# Patient Record
Sex: Female | Born: 1950
Health system: Southern US, Community
[De-identification: ages and names within clinical notes are randomized; demographics above are authoritative.]

## PROBLEM LIST (undated history)

## (undated) DIAGNOSIS — E119 Type 2 diabetes mellitus without complications: Secondary | ICD-10-CM

## (undated) DIAGNOSIS — K219 Gastro-esophageal reflux disease without esophagitis: Secondary | ICD-10-CM

## (undated) DIAGNOSIS — Z9221 Personal history of antineoplastic chemotherapy: Secondary | ICD-10-CM

## (undated) DIAGNOSIS — C50919 Malignant neoplasm of unspecified site of unspecified female breast: Secondary | ICD-10-CM

## (undated) DIAGNOSIS — E559 Vitamin D deficiency, unspecified: Secondary | ICD-10-CM

## (undated) DIAGNOSIS — E785 Hyperlipidemia, unspecified: Secondary | ICD-10-CM

## (undated) HISTORY — PX: BREAST EXCISIONAL BIOPSY: SUR124

## (undated) HISTORY — PX: BREAST BIOPSY: SHX20

---

## 2004-04-25 DIAGNOSIS — Z9221 Personal history of antineoplastic chemotherapy: Secondary | ICD-10-CM

## 2004-04-25 DIAGNOSIS — C50919 Malignant neoplasm of unspecified site of unspecified female breast: Secondary | ICD-10-CM

## 2004-04-25 HISTORY — DX: Personal history of antineoplastic chemotherapy: Z92.21

## 2004-04-25 HISTORY — PX: MASTECTOMY: SHX3

## 2004-04-25 HISTORY — DX: Malignant neoplasm of unspecified site of unspecified female breast: C50.919

## 2005-01-11 ENCOUNTER — Ambulatory Visit: Payer: Self-pay | Admitting: Internal Medicine

## 2005-01-14 ENCOUNTER — Ambulatory Visit: Payer: Self-pay | Admitting: Internal Medicine

## 2005-01-27 ENCOUNTER — Other Ambulatory Visit: Payer: Self-pay

## 2005-01-27 ENCOUNTER — Ambulatory Visit: Payer: Self-pay | Admitting: General Surgery

## 2005-02-02 ENCOUNTER — Ambulatory Visit: Payer: Self-pay | Admitting: General Surgery

## 2005-02-11 ENCOUNTER — Ambulatory Visit: Payer: Self-pay | Admitting: Internal Medicine

## 2005-02-23 ENCOUNTER — Ambulatory Visit: Payer: Self-pay | Admitting: Internal Medicine

## 2005-03-10 ENCOUNTER — Ambulatory Visit: Payer: Self-pay | Admitting: General Surgery

## 2005-03-25 ENCOUNTER — Ambulatory Visit: Payer: Self-pay | Admitting: Internal Medicine

## 2005-04-25 ENCOUNTER — Ambulatory Visit: Payer: Self-pay | Admitting: Internal Medicine

## 2005-05-26 ENCOUNTER — Ambulatory Visit: Payer: Self-pay | Admitting: Internal Medicine

## 2005-06-23 ENCOUNTER — Ambulatory Visit: Payer: Self-pay | Admitting: Internal Medicine

## 2005-07-24 ENCOUNTER — Ambulatory Visit: Payer: Self-pay | Admitting: Internal Medicine

## 2005-08-23 ENCOUNTER — Ambulatory Visit: Payer: Self-pay | Admitting: Internal Medicine

## 2005-09-23 ENCOUNTER — Ambulatory Visit: Payer: Self-pay | Admitting: Internal Medicine

## 2005-10-23 ENCOUNTER — Ambulatory Visit: Payer: Self-pay | Admitting: Internal Medicine

## 2005-11-23 ENCOUNTER — Ambulatory Visit: Payer: Self-pay | Admitting: Internal Medicine

## 2005-12-24 ENCOUNTER — Ambulatory Visit: Payer: Self-pay | Admitting: Internal Medicine

## 2006-01-04 ENCOUNTER — Ambulatory Visit: Payer: Self-pay

## 2006-01-23 ENCOUNTER — Ambulatory Visit: Payer: Self-pay | Admitting: Internal Medicine

## 2006-02-23 ENCOUNTER — Ambulatory Visit: Payer: Self-pay | Admitting: Internal Medicine

## 2006-03-25 ENCOUNTER — Ambulatory Visit: Payer: Self-pay | Admitting: Internal Medicine

## 2006-04-25 ENCOUNTER — Ambulatory Visit: Payer: Self-pay | Admitting: Internal Medicine

## 2006-05-26 ENCOUNTER — Ambulatory Visit: Payer: Self-pay | Admitting: Internal Medicine

## 2006-06-24 ENCOUNTER — Ambulatory Visit: Payer: Self-pay | Admitting: Internal Medicine

## 2006-07-25 ENCOUNTER — Ambulatory Visit: Payer: Self-pay | Admitting: Internal Medicine

## 2006-10-24 ENCOUNTER — Ambulatory Visit: Payer: Self-pay | Admitting: Internal Medicine

## 2006-11-14 ENCOUNTER — Ambulatory Visit: Payer: Self-pay | Admitting: Internal Medicine

## 2006-11-24 ENCOUNTER — Ambulatory Visit: Payer: Self-pay | Admitting: Internal Medicine

## 2007-01-08 ENCOUNTER — Ambulatory Visit: Payer: Self-pay | Admitting: Internal Medicine

## 2007-02-24 ENCOUNTER — Ambulatory Visit: Payer: Self-pay | Admitting: Internal Medicine

## 2007-03-13 ENCOUNTER — Ambulatory Visit: Payer: Self-pay | Admitting: Internal Medicine

## 2007-03-26 ENCOUNTER — Ambulatory Visit: Payer: Self-pay | Admitting: Internal Medicine

## 2007-06-24 ENCOUNTER — Ambulatory Visit: Payer: Self-pay | Admitting: Internal Medicine

## 2007-07-10 ENCOUNTER — Ambulatory Visit: Payer: Self-pay | Admitting: Internal Medicine

## 2007-07-25 ENCOUNTER — Ambulatory Visit: Payer: Self-pay | Admitting: Internal Medicine

## 2007-11-24 ENCOUNTER — Ambulatory Visit: Payer: Self-pay | Admitting: Internal Medicine

## 2007-12-25 ENCOUNTER — Ambulatory Visit: Payer: Self-pay | Admitting: Internal Medicine

## 2008-01-08 ENCOUNTER — Ambulatory Visit: Payer: Self-pay

## 2008-01-24 ENCOUNTER — Ambulatory Visit: Payer: Self-pay | Admitting: Internal Medicine

## 2008-01-25 ENCOUNTER — Ambulatory Visit: Payer: Self-pay | Admitting: Internal Medicine

## 2008-02-24 ENCOUNTER — Ambulatory Visit: Payer: Self-pay | Admitting: Internal Medicine

## 2008-07-24 ENCOUNTER — Ambulatory Visit: Payer: Self-pay | Admitting: Internal Medicine

## 2008-07-29 ENCOUNTER — Ambulatory Visit: Payer: Self-pay | Admitting: Internal Medicine

## 2008-08-23 ENCOUNTER — Ambulatory Visit: Payer: Self-pay | Admitting: Internal Medicine

## 2009-01-07 ENCOUNTER — Ambulatory Visit: Payer: Self-pay

## 2009-01-23 ENCOUNTER — Ambulatory Visit: Payer: Self-pay | Admitting: Internal Medicine

## 2009-01-27 ENCOUNTER — Ambulatory Visit: Payer: Self-pay | Admitting: Internal Medicine

## 2009-02-23 ENCOUNTER — Ambulatory Visit: Payer: Self-pay | Admitting: Internal Medicine

## 2009-07-24 ENCOUNTER — Ambulatory Visit: Payer: Self-pay | Admitting: Internal Medicine

## 2009-07-28 ENCOUNTER — Ambulatory Visit: Payer: Self-pay | Admitting: Internal Medicine

## 2009-08-23 ENCOUNTER — Ambulatory Visit: Payer: Self-pay | Admitting: Internal Medicine

## 2010-01-21 ENCOUNTER — Ambulatory Visit: Payer: Self-pay

## 2010-03-24 ENCOUNTER — Ambulatory Visit: Payer: Self-pay | Admitting: Internal Medicine

## 2010-03-25 ENCOUNTER — Ambulatory Visit: Payer: Self-pay | Admitting: Internal Medicine

## 2010-11-22 ENCOUNTER — Ambulatory Visit: Payer: Self-pay | Admitting: Internal Medicine

## 2010-11-24 ENCOUNTER — Ambulatory Visit: Payer: Self-pay | Admitting: Internal Medicine

## 2010-12-25 ENCOUNTER — Ambulatory Visit: Payer: Self-pay | Admitting: Internal Medicine

## 2011-01-25 ENCOUNTER — Ambulatory Visit: Payer: Self-pay

## 2012-02-15 ENCOUNTER — Ambulatory Visit: Payer: Self-pay

## 2013-02-18 ENCOUNTER — Ambulatory Visit: Payer: Self-pay | Admitting: Family Medicine

## 2014-02-20 ENCOUNTER — Ambulatory Visit: Payer: Self-pay | Admitting: Family Medicine

## 2014-04-08 ENCOUNTER — Emergency Department: Payer: Self-pay | Admitting: Emergency Medicine

## 2014-04-08 LAB — CBC WITH DIFFERENTIAL/PLATELET
BASOS ABS: 0 10*3/uL (ref 0.0–0.1)
BASOS PCT: 0.5 %
EOS ABS: 0 10*3/uL (ref 0.0–0.7)
Eosinophil %: 0.1 %
HCT: 39.8 % (ref 35.0–47.0)
HGB: 12.7 g/dL (ref 12.0–16.0)
LYMPHS PCT: 18 %
Lymphocyte #: 1.5 10*3/uL (ref 1.0–3.6)
MCH: 28.9 pg (ref 26.0–34.0)
MCHC: 31.9 g/dL — ABNORMAL LOW (ref 32.0–36.0)
MCV: 91 fL (ref 80–100)
Monocyte #: 0.2 x10 3/mm (ref 0.2–0.9)
Monocyte %: 2.1 %
NEUTROS PCT: 79.3 %
Neutrophil #: 6.5 10*3/uL (ref 1.4–6.5)
Platelet: 321 10*3/uL (ref 150–440)
RBC: 4.4 10*6/uL (ref 3.80–5.20)
RDW: 14.1 % (ref 11.5–14.5)
WBC: 8.2 10*3/uL (ref 3.6–11.0)

## 2014-04-08 LAB — TROPONIN I
Troponin-I: <0.02 ng/mL
Troponin-I: <0.02 ng/mL

## 2014-04-08 LAB — COMPREHENSIVE METABOLIC PANEL
ANION GAP: 11 (ref 7–16)
AST: 13 U/L — AB (ref 15–37)
Albumin: 4.1 g/dL (ref 3.4–5.0)
Alkaline Phosphatase: 52 U/L
BUN: 6 mg/dL — AB (ref 7–18)
Bilirubin,Total: 0.6 mg/dL (ref 0.2–1.0)
CO2: 26 mmol/L (ref 21–32)
CREATININE: 1.01 mg/dL (ref 0.60–1.30)
Calcium, Total: 8.9 mg/dL (ref 8.5–10.1)
Chloride: 100 mmol/L (ref 98–107)
EGFR (African American): >60
EGFR (Non-African Amer.): 59 — ABNORMAL LOW
Glucose: 156 mg/dL — ABNORMAL HIGH (ref 65–99)
Osmolality: 275 (ref 275–301)
Potassium: 3.3 mmol/L — ABNORMAL LOW (ref 3.5–5.1)
SGPT (ALT): 35 U/L
SODIUM: 137 mmol/L (ref 136–145)
Total Protein: 7.7 g/dL (ref 6.4–8.2)

## 2014-04-08 LAB — LIPASE, BLOOD: Lipase: 119 U/L (ref 73–393)

## 2015-01-14 ENCOUNTER — Other Ambulatory Visit: Payer: Self-pay | Admitting: Family Medicine

## 2015-01-14 DIAGNOSIS — Z1231 Encounter for screening mammogram for malignant neoplasm of breast: Secondary | ICD-10-CM

## 2015-02-23 ENCOUNTER — Ambulatory Visit
Admission: RE | Admit: 2015-02-23 | Discharge: 2015-02-23 | Disposition: A | Discharge status: Home / Self Care | Payer: 59 | Source: Ambulatory Visit | Attending: Family Medicine | Admitting: Family Medicine

## 2015-02-23 DIAGNOSIS — Z1231 Encounter for screening mammogram for malignant neoplasm of breast: Secondary | ICD-10-CM | POA: Insufficient documentation

## 2015-02-23 HISTORY — DX: Malignant neoplasm of unspecified site of unspecified female breast: C50.919

## 2015-02-23 HISTORY — DX: Personal history of antineoplastic chemotherapy: Z92.21

## 2015-04-10 ENCOUNTER — Encounter: Payer: Self-pay | Admitting: *Deleted

## 2015-04-13 ENCOUNTER — Encounter: Payer: Self-pay | Admitting: Anesthesiology

## 2015-04-13 ENCOUNTER — Encounter
Admission: RE | Disposition: A | Discharge status: Home / Self Care | Payer: Self-pay | Source: Ambulatory Visit | Attending: Gastroenterology

## 2015-04-13 ENCOUNTER — Ambulatory Visit: Payer: 59 | Admitting: *Deleted

## 2015-04-13 ENCOUNTER — Ambulatory Visit
Admission: RE | Admit: 2015-04-13 | Discharge: 2015-04-13 | Disposition: A | Discharge status: Home / Self Care | Payer: 59 | Source: Ambulatory Visit | Attending: Gastroenterology | Admitting: Gastroenterology

## 2015-04-13 DIAGNOSIS — E785 Hyperlipidemia, unspecified: Secondary | ICD-10-CM | POA: Diagnosis not present

## 2015-04-13 DIAGNOSIS — E119 Type 2 diabetes mellitus without complications: Secondary | ICD-10-CM | POA: Diagnosis not present

## 2015-04-13 DIAGNOSIS — Z853 Personal history of malignant neoplasm of breast: Secondary | ICD-10-CM | POA: Insufficient documentation

## 2015-04-13 DIAGNOSIS — D123 Benign neoplasm of transverse colon: Secondary | ICD-10-CM | POA: Insufficient documentation

## 2015-04-13 DIAGNOSIS — Z87891 Personal history of nicotine dependence: Secondary | ICD-10-CM | POA: Insufficient documentation

## 2015-04-13 DIAGNOSIS — K219 Gastro-esophageal reflux disease without esophagitis: Secondary | ICD-10-CM | POA: Insufficient documentation

## 2015-04-13 DIAGNOSIS — E559 Vitamin D deficiency, unspecified: Secondary | ICD-10-CM | POA: Diagnosis not present

## 2015-04-13 DIAGNOSIS — Z886 Allergy status to analgesic agent status: Secondary | ICD-10-CM | POA: Diagnosis not present

## 2015-04-13 DIAGNOSIS — E7800 Pure hypercholesterolemia, unspecified: Secondary | ICD-10-CM | POA: Diagnosis not present

## 2015-04-13 DIAGNOSIS — Z1211 Encounter for screening for malignant neoplasm of colon: Secondary | ICD-10-CM | POA: Insufficient documentation

## 2015-04-13 DIAGNOSIS — Z888 Allergy status to other drugs, medicaments and biological substances status: Secondary | ICD-10-CM | POA: Insufficient documentation

## 2015-04-13 DIAGNOSIS — D124 Benign neoplasm of descending colon: Secondary | ICD-10-CM | POA: Diagnosis not present

## 2015-04-13 HISTORY — DX: Vitamin D deficiency, unspecified: E55.9

## 2015-04-13 HISTORY — DX: Hyperlipidemia, unspecified: E78.5

## 2015-04-13 HISTORY — DX: Type 2 diabetes mellitus without complications: E11.9

## 2015-04-13 HISTORY — PX: COLONOSCOPY WITH PROPOFOL: SHX5780

## 2015-04-13 HISTORY — DX: Gastro-esophageal reflux disease without esophagitis: K21.9

## 2015-04-13 SURGERY — COLONOSCOPY WITH PROPOFOL
Anesthesia: General

## 2015-04-13 MED ORDER — SODIUM CHLORIDE 0.9 % IV SOLN
INTRAVENOUS | Status: DC
  Administered 2015-04-13: 1000 mL via INTRAVENOUS

## 2015-04-13 MED ORDER — FENTANYL CITRATE (PF) 100 MCG/2ML IJ SOLN
INTRAMUSCULAR | Status: DC | PRN
  Administered 2015-04-13 (×2): 50 ug via INTRAVENOUS

## 2015-04-13 MED ORDER — MIDAZOLAM HCL 2 MG/2ML IJ SOLN
INTRAMUSCULAR | Status: DC | PRN
  Administered 2015-04-13 (×2): 1 mg via INTRAVENOUS

## 2015-04-13 MED ORDER — PROPOFOL 10 MG/ML IV BOLUS
INTRAVENOUS | Status: DC | PRN
  Administered 2015-04-13 (×4): 20 mg via INTRAVENOUS

## 2015-04-13 MED ORDER — LACTATED RINGERS IV SOLN
INTRAVENOUS | Status: DC | PRN
  Administered 2015-04-13: 12:00:00 via INTRAVENOUS

## 2015-04-13 MED ORDER — SODIUM CHLORIDE 0.9 % IV SOLN: INTRAVENOUS | Status: DC

## 2015-04-13 NOTE — Anesthesia Preprocedure Evaluation (Signed)
Anesthesia Evaluation  Patient identified by MRN, date of birth, ID band Patient awake    Reviewed: Allergy & Precautions, H&P , NPO status , Patient's Chart, lab work & pertinent test results  Airway Mallampati: III  TM Distance: >3 FB Neck ROM: limited    Dental  (+) Poor Dentition, Missing, Upper Dentures   Pulmonary former smoker,    Pulmonary exam normal breath sounds clear to auscultation       Cardiovascular Exercise Tolerance: Good (-) angina(-) Past MI and (-) DOE negative cardio ROS Normal cardiovascular exam Rhythm:regular Rate:Normal     Neuro/Psych negative neurological ROS  negative psych ROS   GI/Hepatic Neg liver ROS, GERD  Controlled,  Endo/Other  diabetes, Type 2  Renal/GU negative Renal ROS  negative genitourinary   Musculoskeletal   Abdominal   Peds  Hematology negative hematology ROS (+)   Anesthesia Other Findings Past Medical History:   Breast cancer ([place])                                            Comment:LT MASTECTOMY   S/P chemotherapy, time since greater than 12 w* 2006           Comment:BREAST CA   Vitamin D deficiency                                         Hyperlipidemia                                               GERD (gastroesophageal reflux disease)                       Diabetes mellitus without complication (HCC)                   Comment:borderline  Past Surgical History:   BREAST BIOPSY                                   Right 20+ YRS A*     Comment:EXCISIONAL - NEG  BMI    Body Mass Index   21.96 kg/m 2      Reproductive/Obstetrics negative OB ROS                             Anesthesia Physical Anesthesia Plan  ASA: III  Anesthesia Plan: General   Post-op Pain Management:    Induction:   Airway Management Planned:   Additional Equipment:   Intra-op Plan:   Post-operative Plan:   Informed Consent: I have reviewed the  patients History and Physical, chart, labs and discussed the procedure including the risks, benefits and alternatives for the proposed anesthesia with the patient or authorized representative who has indicated his/her understanding and acceptance.   Dental Advisory Given  Plan Discussed with: Anesthesiologist, CRNA and Surgeon  Anesthesia Plan Comments:         Anesthesia Quick Evaluation

## 2015-04-13 NOTE — Transfer of Care (Signed)
Immediate Anesthesia Transfer of Care Note  Patient: Candice Leblanc  Procedure(s) Performed: Procedure(s): COLONOSCOPY WITH PROPOFOL (N/A)  Patient Location: PACU  Anesthesia Type:General  Level of Consciousness: awake, alert  and oriented  Airway & Oxygen Therapy: Patient Spontanous Breathing and Patient connected to nasal cannula oxygen  Post-op Assessment: Report given to RN and Post -op Vital signs reviewed and stable  Post vital signs: Reviewed and stable  Last Vitals:  Filed Vitals:   04/13/15 1208  BP: 126/76  Pulse: 90  Temp: 36.2 C  Resp: 17    Complications: No apparent anesthesia complications

## 2015-04-13 NOTE — Op Note (Signed)
[facility] Gastroenterology Patient Name: Candice Leblanc Procedure Date: 04/13/2015 12:18 PM MRN: [id number] Account #: [id number] Date of Birth: October 03, 1950 Admit Type: Outpatient Age: 64 Room: [facility] ENDO ROOM 4 Gender: Female Note Status: Finalized Procedure:         Colonoscopy Indications:       Screening for colorectal malignant neoplasm Providers:         [name]. Candace Cruise, MD Referring MD:      Dion Body (Referring MD) Medicines:         Monitored Anesthesia Care Complications:     No immediate complications. Procedure:         Pre-Anesthesia Assessment:                    - Prior to the procedure, a History and Physical was                     performed, and patient medications, allergies and                     sensitivities were reviewed. The patient's tolerance of                     previous anesthesia was reviewed.                    - The risks and benefits of the procedure and the sedation                     options and risks were discussed with the patient. All                     questions were answered and informed consent was obtained.                    - After reviewing the risks and benefits, the patient was                     deemed in satisfactory condition to undergo the procedure.                    After obtaining informed consent, the colonoscope was                     passed under direct vision. Throughout the procedure, the                     patient's blood pressure, pulse, and oxygen saturations                     were monitored continuously. The Colonoscope was                     introduced through the anus and advanced to the the cecum,                     identified by appendiceal orifice and ileocecal valve. The                     colonoscopy was performed without difficulty. The patient                     tolerated the procedure well. The quality of the bowel  preparation was good. Findings:  Multiple sessile polyps were found in the descending colon, in the       transverse colon and at the hepatic flexure. The polyps were diminutive       in size. These polyps were removed with a cold snare. Polyp resection       was incomplete. The resected tissue was retrieved.      The exam was otherwise without abnormality.      The exam was otherwise without abnormality. Impression:        - Multiple diminutive polyps in the descending colon, in                     the transverse colon and at the hepatic flexure. These may                     be peudopolyps. Incomplete resection. Resected tissue                     retrieved.                    - The examination was otherwise normal.                    - The examination was otherwise normal. Recommendation:    - Discharge patient to home.                    - Await pathology results.                    - The findings and recommendations were discussed with the                     patient. Procedure Code(s): --- Professional ---                    [id number], Colonoscopy, flexible; with removal of tumor(s),                     polyp(s), or other lesion(s) by snare technique Diagnosis Code(s): --- Professional ---                    Z12.11, Encounter for screening for malignant neoplasm of                     colon                    D12.4, Benign neoplasm of descending colon                    D12.3, Benign neoplasm of transverse colon CPT copyright 2014 American Medical Association. All rights reserved. The codes documented in this report are preliminary and upon coder review may  be revised to meet current compliance requirements. Hulen Luster, MD 04/13/2015 12:39:00 PM This report has been signed electronically. Number of Addenda: 0 Note Initiated On: 04/13/2015 12:18 PM Scope Withdrawal Time: 0 hours 6 minutes 57 seconds  Total Procedure Duration: 0 hours 12 minutes 4 seconds       [facility]

## 2015-04-13 NOTE — Anesthesia Postprocedure Evaluation (Signed)
Anesthesia Post Note  Patient: Candice Leblanc  Procedure(s) Performed: Procedure(s) (LRB): COLONOSCOPY WITH PROPOFOL (N/A)  Patient location during evaluation: Endoscopy Anesthesia Type: General Level of consciousness: awake and alert Pain management: pain level controlled Vital Signs Assessment: post-procedure vital signs reviewed and stable Respiratory status: spontaneous breathing, nonlabored ventilation, respiratory function stable and patient connected to nasal cannula oxygen Cardiovascular status: blood pressure returned to baseline and stable Postop Assessment: no signs of nausea or vomiting Anesthetic complications: no    Last Vitals:  Filed Vitals:   04/13/15 1300 04/13/15 1310  BP: 119/62 135/70  Pulse: 62 69  Temp:    Resp: 16 15    Last Pain: There were no vitals filed for this visit.               Precious Haws Piscitello

## 2015-04-13 NOTE — H&P (Signed)
    Primary Care Physician:  Dion Body, MD Primary Gastroenterologist:  Dr. Candace Cruise  Pre-Procedure History & Physical: HPI:  Candice Leblanc is a 64 y.o. female is here for an colonocopy.   Past Medical History  Diagnosis Date  . Breast cancer ([place])     LT MASTECTOMY  . S/P chemotherapy, time since greater than 12 weeks 2006    BREAST CA  . Vitamin D deficiency   . Hyperlipidemia   . GERD (gastroesophageal reflux disease)   . Diabetes mellitus without complication ([place])     borderline    Past Surgical History  Procedure Laterality Date  . Breast biopsy Right 20+ YRS AGO    EXCISIONAL - NEG    Prior to Admission medications   Medication Sig Start Date End Date Taking? Authorizing Provider  alendronate (FOSAMAX) 10 MG tablet Take 10 mg by mouth every 7 (seven) days. Take with a full glass of water on an empty stomach.   Yes Historical Provider, MD  ergocalciferol (VITAMIN D2) 50000 UNITS capsule Take 50,000 Units by mouth once a week.   Yes Historical Provider, MD  fluticasone (FLONASE) 50 MCG/ACT nasal spray Place 2 sprays into both nostrils daily.   Yes Historical Provider, MD  ranitidine (ZANTAC) 150 MG tablet Take 150 mg by mouth daily.   Yes Historical Provider, MD    Allergies as of 04/03/2015  . (Not on File)    Family History  Problem Relation Age of Onset  . Breast cancer Neg Hx     Social History   Social History  . Marital Status: Divorced    Spouse Name: N/A  . Number of Children: N/A  . Years of Education: N/A   Occupational History  . Not on file.   Social History Main Topics  . Smoking status: Former Smoker -- 1.00 packs/day for 10 years    Types: Cigarettes  . Smokeless tobacco: Never Used  . Alcohol Use: Not on file  . Drug Use: No  . Sexual Activity: Not on file   Other Topics Concern  . Not on file   Social History Narrative    Review of Systems: See HPI, otherwise negative ROS  Physical Exam: There were no vitals  taken for this visit. General:   Alert,  pleasant and cooperative in NAD Head:  Normocephalic and atraumatic. Neck:  Supple; no masses or thyromegaly. Lungs:  Clear throughout to auscultation.    Heart:  Regular rate and rhythm. Abdomen:  Soft, nontender and nondistended. Normal bowel sounds, without guarding, and without rebound.   Neurologic:  Alert and  oriented x4;  grossly normal neurologically.  Impression/Plan: Candice Leblanc is here for an colonoscopy to be performed for screening.  Risks, benefits, limitations, and alternatives regarding  colonoscopy have been reviewed with the patient.  Questions have been answered.  All parties agreeable.   [name], [name], MD  04/13/2015, 10:15 AM

## 2015-04-15 LAB — SURGICAL PATHOLOGY

## 2015-04-16 ENCOUNTER — Encounter: Payer: Self-pay | Admitting: Gastroenterology

## 2015-06-16 DIAGNOSIS — E782 Mixed hyperlipidemia: Secondary | ICD-10-CM | POA: Diagnosis not present

## 2015-06-16 DIAGNOSIS — E559 Vitamin D deficiency, unspecified: Secondary | ICD-10-CM | POA: Diagnosis not present

## 2015-06-23 DIAGNOSIS — K219 Gastro-esophageal reflux disease without esophagitis: Secondary | ICD-10-CM | POA: Diagnosis not present

## 2015-06-23 DIAGNOSIS — E782 Mixed hyperlipidemia: Secondary | ICD-10-CM | POA: Diagnosis not present

## 2015-06-23 DIAGNOSIS — E559 Vitamin D deficiency, unspecified: Secondary | ICD-10-CM | POA: Diagnosis not present

## 2015-08-12 DIAGNOSIS — R69 Illness, unspecified: Secondary | ICD-10-CM | POA: Diagnosis not present

## 2016-01-06 DIAGNOSIS — R300 Dysuria: Secondary | ICD-10-CM | POA: Diagnosis not present

## 2016-01-08 ENCOUNTER — Other Ambulatory Visit: Payer: Self-pay | Admitting: Family Medicine

## 2016-01-08 DIAGNOSIS — Z1231 Encounter for screening mammogram for malignant neoplasm of breast: Secondary | ICD-10-CM

## 2016-02-24 ENCOUNTER — Ambulatory Visit
Admission: RE | Admit: 2016-02-24 | Discharge: 2016-02-24 | Disposition: A | Discharge status: Home / Self Care | Payer: Commercial Managed Care - HMO | Source: Ambulatory Visit | Attending: Family Medicine | Admitting: Family Medicine

## 2016-02-24 DIAGNOSIS — Z1231 Encounter for screening mammogram for malignant neoplasm of breast: Secondary | ICD-10-CM | POA: Insufficient documentation

## 2016-02-26 ENCOUNTER — Other Ambulatory Visit: Payer: Self-pay | Admitting: Family Medicine

## 2016-02-26 DIAGNOSIS — N6310 Unspecified lump in the right breast, unspecified quadrant: Secondary | ICD-10-CM

## 2016-03-10 ENCOUNTER — Ambulatory Visit
Admission: RE | Admit: 2016-03-10 | Discharge: 2016-03-10 | Disposition: A | Discharge status: Home / Self Care | Payer: Commercial Managed Care - HMO | Source: Ambulatory Visit | Attending: Family Medicine | Admitting: Family Medicine

## 2016-03-10 DIAGNOSIS — N6310 Unspecified lump in the right breast, unspecified quadrant: Secondary | ICD-10-CM

## 2016-03-10 DIAGNOSIS — R928 Other abnormal and inconclusive findings on diagnostic imaging of breast: Secondary | ICD-10-CM | POA: Diagnosis not present

## 2016-03-11 DIAGNOSIS — Z0000 Encounter for general adult medical examination without abnormal findings: Secondary | ICD-10-CM | POA: Diagnosis not present

## 2016-03-11 DIAGNOSIS — E559 Vitamin D deficiency, unspecified: Secondary | ICD-10-CM | POA: Diagnosis not present

## 2016-03-11 DIAGNOSIS — E782 Mixed hyperlipidemia: Secondary | ICD-10-CM | POA: Diagnosis not present

## 2016-03-25 DIAGNOSIS — E782 Mixed hyperlipidemia: Secondary | ICD-10-CM | POA: Diagnosis not present

## 2016-03-25 DIAGNOSIS — Z0000 Encounter for general adult medical examination without abnormal findings: Secondary | ICD-10-CM | POA: Diagnosis not present

## 2016-03-25 DIAGNOSIS — E559 Vitamin D deficiency, unspecified: Secondary | ICD-10-CM | POA: Diagnosis not present

## 2016-06-06 DIAGNOSIS — J069 Acute upper respiratory infection, unspecified: Secondary | ICD-10-CM | POA: Diagnosis not present

## 2016-06-09 DIAGNOSIS — J019 Acute sinusitis, unspecified: Secondary | ICD-10-CM | POA: Diagnosis not present

## 2016-06-09 DIAGNOSIS — B9689 Other specified bacterial agents as the cause of diseases classified elsewhere: Secondary | ICD-10-CM | POA: Diagnosis not present

## 2016-09-20 DIAGNOSIS — E782 Mixed hyperlipidemia: Secondary | ICD-10-CM | POA: Diagnosis not present

## 2016-09-20 DIAGNOSIS — E559 Vitamin D deficiency, unspecified: Secondary | ICD-10-CM | POA: Diagnosis not present

## 2016-09-27 DIAGNOSIS — E559 Vitamin D deficiency, unspecified: Secondary | ICD-10-CM | POA: Diagnosis not present

## 2016-09-27 DIAGNOSIS — E7800 Pure hypercholesterolemia, unspecified: Secondary | ICD-10-CM | POA: Diagnosis not present

## 2016-09-27 DIAGNOSIS — K219 Gastro-esophageal reflux disease without esophagitis: Secondary | ICD-10-CM | POA: Diagnosis not present

## 2016-12-19 DIAGNOSIS — M62838 Other muscle spasm: Secondary | ICD-10-CM | POA: Diagnosis not present

## 2017-01-16 ENCOUNTER — Other Ambulatory Visit: Payer: Self-pay | Admitting: Family Medicine

## 2017-01-16 DIAGNOSIS — Z1231 Encounter for screening mammogram for malignant neoplasm of breast: Secondary | ICD-10-CM

## 2017-02-27 ENCOUNTER — Ambulatory Visit
Admission: RE | Admit: 2017-02-27 | Discharge: 2017-02-27 | Disposition: A | Discharge status: Home / Self Care | Payer: Commercial Managed Care - HMO | Source: Ambulatory Visit | Attending: Family Medicine | Admitting: Family Medicine

## 2017-02-27 DIAGNOSIS — Z1231 Encounter for screening mammogram for malignant neoplasm of breast: Secondary | ICD-10-CM | POA: Diagnosis not present

## 2017-03-24 DIAGNOSIS — K219 Gastro-esophageal reflux disease without esophagitis: Secondary | ICD-10-CM | POA: Diagnosis not present

## 2017-03-24 DIAGNOSIS — E559 Vitamin D deficiency, unspecified: Secondary | ICD-10-CM | POA: Diagnosis not present

## 2017-03-24 DIAGNOSIS — E7800 Pure hypercholesterolemia, unspecified: Secondary | ICD-10-CM | POA: Diagnosis not present

## 2017-03-31 DIAGNOSIS — Z0000 Encounter for general adult medical examination without abnormal findings: Secondary | ICD-10-CM | POA: Diagnosis not present

## 2017-03-31 DIAGNOSIS — E559 Vitamin D deficiency, unspecified: Secondary | ICD-10-CM | POA: Diagnosis not present

## 2017-03-31 DIAGNOSIS — E7800 Pure hypercholesterolemia, unspecified: Secondary | ICD-10-CM | POA: Diagnosis not present

## 2017-09-22 DIAGNOSIS — E7800 Pure hypercholesterolemia, unspecified: Secondary | ICD-10-CM | POA: Diagnosis not present

## 2017-09-22 DIAGNOSIS — E559 Vitamin D deficiency, unspecified: Secondary | ICD-10-CM | POA: Diagnosis not present

## 2017-09-29 DIAGNOSIS — K219 Gastro-esophageal reflux disease without esophagitis: Secondary | ICD-10-CM | POA: Diagnosis not present

## 2017-09-29 DIAGNOSIS — E7800 Pure hypercholesterolemia, unspecified: Secondary | ICD-10-CM | POA: Diagnosis not present

## 2017-09-29 DIAGNOSIS — E559 Vitamin D deficiency, unspecified: Secondary | ICD-10-CM | POA: Diagnosis not present

## 2018-01-01 ENCOUNTER — Other Ambulatory Visit: Payer: Self-pay | Admitting: Family Medicine

## 2018-01-01 DIAGNOSIS — Z1231 Encounter for screening mammogram for malignant neoplasm of breast: Secondary | ICD-10-CM

## 2018-02-26 ENCOUNTER — Telehealth: Payer: Self-pay | Admitting: *Deleted

## 2018-02-26 NOTE — Telephone Encounter (Signed)
Opened in error

## 2018-02-28 ENCOUNTER — Ambulatory Visit
Admission: RE | Admit: 2018-02-28 | Discharge: 2018-02-28 | Disposition: A | Discharge status: Home / Self Care | Payer: Commercial Managed Care - HMO | Source: Ambulatory Visit | Attending: Family Medicine | Admitting: Family Medicine

## 2018-02-28 DIAGNOSIS — Z1231 Encounter for screening mammogram for malignant neoplasm of breast: Secondary | ICD-10-CM | POA: Diagnosis not present

## 2018-04-03 DIAGNOSIS — K219 Gastro-esophageal reflux disease without esophagitis: Secondary | ICD-10-CM | POA: Diagnosis not present

## 2018-04-03 DIAGNOSIS — E559 Vitamin D deficiency, unspecified: Secondary | ICD-10-CM | POA: Diagnosis not present

## 2018-04-03 DIAGNOSIS — E7800 Pure hypercholesterolemia, unspecified: Secondary | ICD-10-CM | POA: Diagnosis not present

## 2018-04-10 DIAGNOSIS — E559 Vitamin D deficiency, unspecified: Secondary | ICD-10-CM | POA: Diagnosis not present

## 2018-04-10 DIAGNOSIS — K219 Gastro-esophageal reflux disease without esophagitis: Secondary | ICD-10-CM | POA: Diagnosis not present

## 2018-04-10 DIAGNOSIS — Z0000 Encounter for general adult medical examination without abnormal findings: Secondary | ICD-10-CM | POA: Diagnosis not present

## 2018-04-10 DIAGNOSIS — E7800 Pure hypercholesterolemia, unspecified: Secondary | ICD-10-CM | POA: Diagnosis not present

## 2018-08-07 DIAGNOSIS — E559 Vitamin D deficiency, unspecified: Secondary | ICD-10-CM | POA: Diagnosis not present

## 2018-08-07 DIAGNOSIS — E7800 Pure hypercholesterolemia, unspecified: Secondary | ICD-10-CM | POA: Diagnosis not present

## 2018-08-14 DIAGNOSIS — E559 Vitamin D deficiency, unspecified: Secondary | ICD-10-CM | POA: Diagnosis not present

## 2018-08-14 DIAGNOSIS — Z780 Asymptomatic menopausal state: Secondary | ICD-10-CM | POA: Diagnosis not present

## 2018-08-14 DIAGNOSIS — Z0000 Encounter for general adult medical examination without abnormal findings: Secondary | ICD-10-CM | POA: Diagnosis not present

## 2018-08-14 DIAGNOSIS — E7800 Pure hypercholesterolemia, unspecified: Secondary | ICD-10-CM | POA: Diagnosis not present

## 2018-08-14 DIAGNOSIS — K219 Gastro-esophageal reflux disease without esophagitis: Secondary | ICD-10-CM | POA: Diagnosis not present

## 2019-01-07 ENCOUNTER — Other Ambulatory Visit: Payer: Self-pay | Admitting: Family Medicine

## 2019-01-07 DIAGNOSIS — Z1231 Encounter for screening mammogram for malignant neoplasm of breast: Secondary | ICD-10-CM

## 2019-02-06 DIAGNOSIS — Z780 Asymptomatic menopausal state: Secondary | ICD-10-CM | POA: Diagnosis not present

## 2019-02-06 DIAGNOSIS — E7800 Pure hypercholesterolemia, unspecified: Secondary | ICD-10-CM | POA: Diagnosis not present

## 2019-02-13 DIAGNOSIS — K219 Gastro-esophageal reflux disease without esophagitis: Secondary | ICD-10-CM | POA: Diagnosis not present

## 2019-02-13 DIAGNOSIS — Z87891 Personal history of nicotine dependence: Secondary | ICD-10-CM | POA: Diagnosis not present

## 2019-02-13 DIAGNOSIS — E7800 Pure hypercholesterolemia, unspecified: Secondary | ICD-10-CM | POA: Diagnosis not present

## 2019-02-13 DIAGNOSIS — E559 Vitamin D deficiency, unspecified: Secondary | ICD-10-CM | POA: Diagnosis not present

## 2019-03-04 ENCOUNTER — Ambulatory Visit
Admission: RE | Admit: 2019-03-04 | Discharge: 2019-03-04 | Disposition: A | Discharge status: Home / Self Care | Payer: Medicare HMO | Source: Ambulatory Visit | Attending: Family Medicine | Admitting: Family Medicine

## 2019-03-04 DIAGNOSIS — R921 Mammographic calcification found on diagnostic imaging of breast: Secondary | ICD-10-CM | POA: Diagnosis not present

## 2019-03-04 DIAGNOSIS — Z1231 Encounter for screening mammogram for malignant neoplasm of breast: Secondary | ICD-10-CM

## 2019-03-06 ENCOUNTER — Other Ambulatory Visit: Payer: Self-pay | Admitting: Family Medicine

## 2019-03-06 DIAGNOSIS — R928 Other abnormal and inconclusive findings on diagnostic imaging of breast: Secondary | ICD-10-CM

## 2019-03-06 DIAGNOSIS — R921 Mammographic calcification found on diagnostic imaging of breast: Secondary | ICD-10-CM

## 2019-03-13 ENCOUNTER — Ambulatory Visit
Admission: RE | Admit: 2019-03-13 | Discharge: 2019-03-13 | Disposition: A | Discharge status: Home / Self Care | Payer: Medicare HMO | Source: Ambulatory Visit | Attending: Family Medicine | Admitting: Family Medicine

## 2019-03-13 DIAGNOSIS — R921 Mammographic calcification found on diagnostic imaging of breast: Secondary | ICD-10-CM

## 2019-03-13 DIAGNOSIS — R928 Other abnormal and inconclusive findings on diagnostic imaging of breast: Secondary | ICD-10-CM | POA: Diagnosis not present

## 2019-07-12 ENCOUNTER — Ambulatory Visit: Payer: Medicare HMO | Attending: Internal Medicine

## 2019-07-12 ENCOUNTER — Other Ambulatory Visit: Payer: Self-pay

## 2019-07-12 DIAGNOSIS — Z23 Encounter for immunization: Secondary | ICD-10-CM

## 2019-07-12 NOTE — Progress Notes (Signed)
   Covid-19 Vaccination Clinic  Name:  Jesseka Swoveland    MRN: [id number] DOB: 1950-11-05  07/12/2019  Ms. Bieler was observed post Covid-19 immunization for 15 minutes without incident. She was provided with Vaccine Information Sheet and instruction to access the V-Safe system.   Ms. Cuffe was instructed to call 911 with any severe reactions post vaccine: Marland Kitchen Difficulty breathing  . Swelling of face and throat  . A fast heartbeat  . A bad rash all over body  . Dizziness and weakness   Immunizations Administered    Name Date Dose VIS Date Route   Pfizer COVID-19 Vaccine 07/12/2019  9:57 AM 0.3 mL 04/05/2019 Intramuscular   Manufacturer: [place]   Lot: [id number]   [place]: [id number]

## 2019-08-06 ENCOUNTER — Ambulatory Visit: Payer: Medicare HMO | Attending: Internal Medicine

## 2019-08-06 DIAGNOSIS — Z23 Encounter for immunization: Secondary | ICD-10-CM

## 2019-08-06 NOTE — Progress Notes (Signed)
   Covid-19 Vaccination Clinic  Name:  Candice Leblanc    MRN: [id number] DOB: [birth year]  08/06/2019  [name] was observed post Covid-19 immunization for 15 minutes without incident. She was provided with Vaccine Information Sheet and instruction to access the V-Safe system.   [name] was instructed to call 911 with any severe reactions post vaccine: Marland Kitchen Difficulty breathing  . Swelling of face and throat  . A fast heartbeat  . A bad rash all over body  . Dizziness and weakness   Immunizations Administered    Name Date Dose VIS Date Route   Pfizer COVID-19 Vaccine 08/06/2019 10:48 AM 0.3 mL 04/05/2019 Intramuscular   Manufacturer: [place]   Lot: [id number]   [place]: [id number]

## 2019-08-07 DIAGNOSIS — E7800 Pure hypercholesterolemia, unspecified: Secondary | ICD-10-CM | POA: Diagnosis not present

## 2019-08-14 DIAGNOSIS — Z0000 Encounter for general adult medical examination without abnormal findings: Secondary | ICD-10-CM | POA: Diagnosis not present

## 2019-08-14 DIAGNOSIS — E785 Hyperlipidemia, unspecified: Secondary | ICD-10-CM | POA: Diagnosis not present

## 2019-08-14 DIAGNOSIS — Z87891 Personal history of nicotine dependence: Secondary | ICD-10-CM | POA: Diagnosis not present

## 2019-10-22 DIAGNOSIS — Z87891 Personal history of nicotine dependence: Secondary | ICD-10-CM | POA: Diagnosis not present

## 2019-10-22 DIAGNOSIS — M545 Low back pain: Secondary | ICD-10-CM | POA: Diagnosis not present

## 2020-01-21 ENCOUNTER — Other Ambulatory Visit: Payer: Self-pay | Admitting: Family Medicine

## 2020-01-21 DIAGNOSIS — Z1231 Encounter for screening mammogram for malignant neoplasm of breast: Secondary | ICD-10-CM

## 2020-02-13 DIAGNOSIS — E7800 Pure hypercholesterolemia, unspecified: Secondary | ICD-10-CM | POA: Diagnosis not present

## 2020-02-20 DIAGNOSIS — E559 Vitamin D deficiency, unspecified: Secondary | ICD-10-CM | POA: Diagnosis not present

## 2020-02-20 DIAGNOSIS — K219 Gastro-esophageal reflux disease without esophagitis: Secondary | ICD-10-CM | POA: Diagnosis not present

## 2020-02-20 DIAGNOSIS — E7800 Pure hypercholesterolemia, unspecified: Secondary | ICD-10-CM | POA: Diagnosis not present

## 2020-02-20 DIAGNOSIS — F4321 Adjustment disorder with depressed mood: Secondary | ICD-10-CM | POA: Diagnosis not present

## 2020-03-04 ENCOUNTER — Ambulatory Visit
Admission: RE | Admit: 2020-03-04 | Discharge: 2020-03-04 | Disposition: A | Discharge status: Home / Self Care | Payer: Medicare HMO | Source: Ambulatory Visit | Attending: Family Medicine | Admitting: Family Medicine

## 2020-03-04 ENCOUNTER — Other Ambulatory Visit: Payer: Self-pay

## 2020-03-04 DIAGNOSIS — Z1231 Encounter for screening mammogram for malignant neoplasm of breast: Secondary | ICD-10-CM | POA: Insufficient documentation

## 2020-07-06 DIAGNOSIS — N390 Urinary tract infection, site not specified: Secondary | ICD-10-CM | POA: Diagnosis not present

## 2020-07-06 DIAGNOSIS — R1031 Right lower quadrant pain: Secondary | ICD-10-CM | POA: Diagnosis not present

## 2020-07-06 DIAGNOSIS — R1032 Left lower quadrant pain: Secondary | ICD-10-CM | POA: Diagnosis not present

## 2020-07-06 DIAGNOSIS — K219 Gastro-esophageal reflux disease without esophagitis: Secondary | ICD-10-CM | POA: Diagnosis not present

## 2020-07-17 DIAGNOSIS — R350 Frequency of micturition: Secondary | ICD-10-CM | POA: Diagnosis not present

## 2020-07-17 DIAGNOSIS — K219 Gastro-esophageal reflux disease without esophagitis: Secondary | ICD-10-CM | POA: Diagnosis not present

## 2020-08-12 DIAGNOSIS — E7800 Pure hypercholesterolemia, unspecified: Secondary | ICD-10-CM | POA: Diagnosis not present

## 2020-08-12 DIAGNOSIS — K219 Gastro-esophageal reflux disease without esophagitis: Secondary | ICD-10-CM | POA: Diagnosis not present

## 2020-08-19 DIAGNOSIS — Z0000 Encounter for general adult medical examination without abnormal findings: Secondary | ICD-10-CM | POA: Diagnosis not present

## 2020-08-19 DIAGNOSIS — D72819 Decreased white blood cell count, unspecified: Secondary | ICD-10-CM | POA: Diagnosis not present

## 2020-08-19 DIAGNOSIS — E785 Hyperlipidemia, unspecified: Secondary | ICD-10-CM | POA: Diagnosis not present

## 2020-09-01 ENCOUNTER — Inpatient Hospital Stay
Admission: EM | Admit: 2020-09-01 | Discharge: 2020-09-05 | DRG: 419 | Disposition: A | Discharge status: Home / Self Care | Payer: Medicare HMO | Attending: Internal Medicine | Admitting: Internal Medicine

## 2020-09-01 ENCOUNTER — Other Ambulatory Visit: Payer: Self-pay

## 2020-09-01 ENCOUNTER — Emergency Department: Payer: Medicare HMO

## 2020-09-01 DIAGNOSIS — Z9221 Personal history of antineoplastic chemotherapy: Secondary | ICD-10-CM | POA: Diagnosis not present

## 2020-09-01 DIAGNOSIS — Z79899 Other long term (current) drug therapy: Secondary | ICD-10-CM

## 2020-09-01 DIAGNOSIS — D132 Benign neoplasm of duodenum: Secondary | ICD-10-CM | POA: Diagnosis not present

## 2020-09-01 DIAGNOSIS — E876 Hypokalemia: Secondary | ICD-10-CM | POA: Diagnosis present

## 2020-09-01 DIAGNOSIS — Z886 Allergy status to analgesic agent status: Secondary | ICD-10-CM | POA: Diagnosis not present

## 2020-09-01 DIAGNOSIS — R748 Abnormal levels of other serum enzymes: Secondary | ICD-10-CM | POA: Diagnosis present

## 2020-09-01 DIAGNOSIS — K219 Gastro-esophageal reflux disease without esophagitis: Secondary | ICD-10-CM | POA: Diagnosis present

## 2020-09-01 DIAGNOSIS — Z7983 Long term (current) use of bisphosphonates: Secondary | ICD-10-CM | POA: Diagnosis not present

## 2020-09-01 DIAGNOSIS — N281 Cyst of kidney, acquired: Secondary | ICD-10-CM | POA: Diagnosis not present

## 2020-09-01 DIAGNOSIS — E119 Type 2 diabetes mellitus without complications: Secondary | ICD-10-CM | POA: Diagnosis present

## 2020-09-01 DIAGNOSIS — R109 Unspecified abdominal pain: Secondary | ICD-10-CM

## 2020-09-01 DIAGNOSIS — K8050 Calculus of bile duct without cholangitis or cholecystitis without obstruction: Secondary | ICD-10-CM | POA: Diagnosis present

## 2020-09-01 DIAGNOSIS — K8020 Calculus of gallbladder without cholecystitis without obstruction: Secondary | ICD-10-CM | POA: Diagnosis not present

## 2020-09-01 DIAGNOSIS — Z87891 Personal history of nicotine dependence: Secondary | ICD-10-CM

## 2020-09-01 DIAGNOSIS — Z9012 Acquired absence of left breast and nipple: Secondary | ICD-10-CM | POA: Diagnosis not present

## 2020-09-01 DIAGNOSIS — E782 Mixed hyperlipidemia: Secondary | ICD-10-CM

## 2020-09-01 DIAGNOSIS — K8070 Calculus of gallbladder and bile duct without cholecystitis without obstruction: Secondary | ICD-10-CM | POA: Diagnosis not present

## 2020-09-01 DIAGNOSIS — Z803 Family history of malignant neoplasm of breast: Secondary | ICD-10-CM | POA: Diagnosis not present

## 2020-09-01 DIAGNOSIS — Z20822 Contact with and (suspected) exposure to covid-19: Secondary | ICD-10-CM | POA: Diagnosis present

## 2020-09-01 DIAGNOSIS — K8010 Calculus of gallbladder with chronic cholecystitis without obstruction: Secondary | ICD-10-CM | POA: Diagnosis not present

## 2020-09-01 DIAGNOSIS — R9431 Abnormal electrocardiogram [ECG] [EKG]: Secondary | ICD-10-CM | POA: Diagnosis not present

## 2020-09-01 DIAGNOSIS — R1111 Vomiting without nausea: Secondary | ICD-10-CM | POA: Diagnosis not present

## 2020-09-01 DIAGNOSIS — Z853 Personal history of malignant neoplasm of breast: Secondary | ICD-10-CM

## 2020-09-01 DIAGNOSIS — R7989 Other specified abnormal findings of blood chemistry: Secondary | ICD-10-CM | POA: Diagnosis not present

## 2020-09-01 DIAGNOSIS — K838 Other specified diseases of biliary tract: Secondary | ICD-10-CM | POA: Diagnosis not present

## 2020-09-01 DIAGNOSIS — I440 Atrioventricular block, first degree: Secondary | ICD-10-CM | POA: Diagnosis present

## 2020-09-01 DIAGNOSIS — K8065 Calculus of gallbladder and bile duct with chronic cholecystitis with obstruction: Secondary | ICD-10-CM | POA: Diagnosis present

## 2020-09-01 DIAGNOSIS — R17 Unspecified jaundice: Secondary | ICD-10-CM

## 2020-09-01 LAB — CBC WITH DIFFERENTIAL/PLATELET
Abs Immature Granulocytes: 0.03 10*3/uL (ref 0.00–0.07)
Basophils Absolute: 0 10*3/uL (ref 0.0–0.1)
Basophils Relative: 0 %
Eosinophils Absolute: 0 10*3/uL (ref 0.0–0.5)
Eosinophils Relative: 0 %
HCT: 44.5 % (ref 36.0–46.0)
Hemoglobin: 14.8 g/dL (ref 12.0–15.0)
Immature Granulocytes: 0 %
Lymphocytes Relative: 5 %
Lymphs Abs: 0.5 10*3/uL — ABNORMAL LOW (ref 0.7–4.0)
MCH: 29.3 pg (ref 26.0–34.0)
MCHC: 33.3 g/dL (ref 30.0–36.0)
MCV: 88.1 fL (ref 80.0–100.0)
Monocytes Absolute: 0.4 10*3/uL (ref 0.1–1.0)
Monocytes Relative: 4 %
Neutro Abs: 8.7 10*3/uL — ABNORMAL HIGH (ref 1.7–7.7)
Neutrophils Relative %: 91 %
Platelets: 281 10*3/uL (ref 150–400)
RBC: 5.05 MIL/uL (ref 3.87–5.11)
RDW: 14.4 % (ref 11.5–15.5)
WBC: 9.6 10*3/uL (ref 4.0–10.5)
nRBC: 0 % (ref 0.0–0.2)

## 2020-09-01 LAB — COMPREHENSIVE METABOLIC PANEL
ALT: 717 U/L — ABNORMAL HIGH (ref 0–44)
AST: 847 U/L — ABNORMAL HIGH (ref 15–41)
Albumin: 4.4 g/dL (ref 3.5–5.0)
Alkaline Phosphatase: 150 U/L — ABNORMAL HIGH (ref 38–126)
Anion gap: 11 (ref 5–15)
BUN: 14 mg/dL (ref 8–23)
CO2: 26 mmol/L (ref 22–32)
Calcium: 9.7 mg/dL (ref 8.9–10.3)
Chloride: 99 mmol/L (ref 98–111)
Creatinine, Ser: 0.82 mg/dL (ref 0.44–1.00)
GFR, Estimated: >60 mL/min (ref >60)
Glucose, Bld: 117 mg/dL — ABNORMAL HIGH (ref 70–99)
Potassium: 3.8 mmol/L (ref 3.5–5.1)
Sodium: 136 mmol/L (ref 135–145)
Total Bilirubin: 4.5 mg/dL — ABNORMAL HIGH (ref 0.3–1.2)
Total Protein: 8 g/dL (ref 6.5–8.1)

## 2020-09-01 LAB — URINALYSIS, COMPLETE (UACMP) WITH MICROSCOPIC
Bacteria, UA: NONE SEEN
Bilirubin Urine: NEGATIVE
Glucose, UA: NEGATIVE mg/dL
Hgb urine dipstick: NEGATIVE
Ketones, ur: NEGATIVE mg/dL
Leukocytes,Ua: NEGATIVE
Nitrite: NEGATIVE
Protein, ur: NEGATIVE mg/dL
Specific Gravity, Urine: 1.014 (ref 1.005–1.030)
pH: 6 (ref 5.0–8.0)

## 2020-09-01 LAB — RESP PANEL BY RT-PCR (FLU A&B, COVID) ARPGX2
Influenza A by PCR: NEGATIVE
Influenza B by PCR: NEGATIVE
SARS Coronavirus 2 by RT PCR: NEGATIVE

## 2020-09-01 LAB — LIPASE, BLOOD: Lipase: 44 U/L (ref 11–51)

## 2020-09-01 MED ORDER — IBUPROFEN 400 MG PO TABS: 400.0000 mg | ORAL_TABLET | Freq: Once | ORAL | Status: DC

## 2020-09-01 MED ORDER — FAMOTIDINE 20 MG PO TABS
40.0000 mg | ORAL_TABLET | Freq: Two times a day (BID) | ORAL | Status: DC
  Administered 2020-09-01 – 2020-09-05 (×6): 40 mg via ORAL
  Filled 2020-09-01 (×7): qty 2

## 2020-09-01 MED ORDER — ONDANSETRON HCL 4 MG/2ML IJ SOLN: 4.0000 mg | Freq: Four times a day (QID) | INTRAMUSCULAR | Status: DC | PRN

## 2020-09-01 MED ORDER — ONDANSETRON HCL 4 MG PO TABS: 4.0000 mg | ORAL_TABLET | Freq: Four times a day (QID) | ORAL | Status: DC | PRN

## 2020-09-01 MED ORDER — FLUTICASONE PROPIONATE 50 MCG/ACT NA SUSP
2.0000 | Freq: Every day | NASAL | Status: DC
  Administered 2020-09-02 – 2020-09-05 (×2): 2 via NASAL
  Filled 2020-09-01: qty 16

## 2020-09-01 MED ORDER — MORPHINE SULFATE (PF) 2 MG/ML IV SOLN: 1.0000 mg | INTRAVENOUS | Status: AC | PRN

## 2020-09-01 MED ORDER — IBUPROFEN 400 MG PO TABS: 600.0000 mg | ORAL_TABLET | Freq: Four times a day (QID) | ORAL | Status: AC | PRN

## 2020-09-01 MED ORDER — LORAZEPAM 2 MG/ML IJ SOLN: 1.0000 mg | INTRAMUSCULAR | Status: DC | PRN

## 2020-09-01 NOTE — Progress Notes (Signed)
Patient oriented to room and call bell.  Independent in room.  Daughter at bedside.

## 2020-09-01 NOTE — ED Triage Notes (Signed)
70 yo fm with hx of hernia presents with epigastric pain since last night. Denies n/v/d

## 2020-09-01 NOTE — ED Notes (Signed)
Messaged RN bed assigned 

## 2020-09-01 NOTE — H&P (Signed)
History and Physical   Candice Leblanc JOA:[id number] DOB: Jan 15, 1951 DOA: 09/01/2020  PCP: Candice Body, MD  Outpatient Specialists: no Patient coming from: home  I have personally briefly reviewed patient's old medical records in [place].  Chief Concern: abdominal pain  HPI: Candice Leblanc is a 70 y.o. female with medical history significant for hyperlipidemia, GERD, history of left breast cancer s/p chemotherapy Aug 23, 2004) and masectomy, presents to the ED for chief concern of abdominal pain and nausea.   She states the pain started evening 08/31/20, and describes the pain as a burning sensation in the right upper quadrant, and at it's peak, the pain was 9/10, and now is 0/10. The pain lasted approximately about 1 hour while she was in bed. The pain improved with milk on evening of 08/31/20. She states she took one sucralfate 1 gm AM of 09/01/20.   She denies nausea, diarrhea, chest pain, shortness of breath.   Social history: she lives alone. Previously she lived with her mother and her mother passed away in 2019-08-24 from stroke. She formerly was a tobacco user, quit 2004/08/23. She denies etoh, recreational drugs. She formerly worked as a Cabin crew.   Vaccinations: Pfizer vaccines 07/12/2019, 4/31/21, 04/13/2020  ROS: Constitutional: no weight change, no fever ENT/Mouth: no sore throat, no rhinorrhea Eyes: no eye pain, no vision changes Cardiovascular: no chest pain, no dyspnea,  no edema, no palpitations Respiratory: no cough, no sputum, no wheezing Gastrointestinal: no nausea, no vomiting, no diarrhea, no constipation Genitourinary: no urinary incontinence, no dysuria, no hematuria Musculoskeletal: no arthralgias, no myalgias Skin: no skin lesions, no pruritus, Neuro: no weakness, no loss of consciousness, no syncope Psych: no anxiety, no depression, + decrease appetite Heme/Lymph: no bruising, no bleeding  ED Course: Discussed with EDP, patient  requiring hospitalization for chief concern of abdominal pain suspect choledocholithiasis.  Vitals in the ED showed temperature of 97.5, respiration rate of 15, blood pressure 135/72, spO2 of 98% on Room air, heart rate of 80.   Assessment/Plan  Principal Problem:   Choledocholithiasis Active Problems:   Elevated liver enzymes   Hyperlipidemia, mixed   Abdominal pain - concerning for choledocholithiasis - MRCP ordered - Dr. Marius Ditch has been consulted by EDP and will follow along with the patient during her hospitalization - Pain control: ibuprofen 600 mg PO q6h prn for fever, headache, mild pain, and morphine 1 mg IV q4h prn for moderate and severe pain - AM labs: CBC, BMP, and Hepatic function ordered  GERD - famotidine 40 mg BID  HLD - holding home atorvastatin due to elevated LFT  DVT prophylaxis - I have only ordered ted hose, as patient may get ERCP  Chart reviewed.   DVT prophylaxis: ted hose Code Status: full code  Diet: regular diet Family Communication: no Disposition Plan: pending clinical course Consults called: GI via EDP Admission status: med-surg, observation, telemetry  Past Medical History:  Diagnosis Date  . Breast cancer ([place]) 23-Aug-2004   LT MASTECTOMY  . Diabetes mellitus without complication (HCC)    borderline  . GERD (gastroesophageal reflux disease)   . Hyperlipidemia   . S/P chemotherapy, time since greater than 12 weeks 08-23-2004   BREAST CA  . Vitamin D deficiency    Past Surgical History:  Procedure Laterality Date  . BREAST EXCISIONAL BIOPSY Right 08-24-1998?   EXCISIONAL - NEG  . COLONOSCOPY WITH PROPOFOL N/A 04/13/2015   Procedure: COLONOSCOPY WITH PROPOFOL;  Surgeon: Hulen Luster, MD;  [facility]  ENDOSCOPY;  Service: Gastroenterology;  Laterality: N/A;  . MASTECTOMY Left 2006   breast ca   Social History:  reports that she has quit smoking. Her smoking use included cigarettes. She has a 10.00 pack-year smoking history. She has never used smokeless  tobacco. She reports that she does not drink alcohol and does not use drugs.  Allergies  Allergen Reactions  . Aspirin Other (See Comments)    Gi upset   Family History  Problem Relation Age of Onset  . Breast cancer Sister 93   Family history: Family history reviewed and not pertinent  Prior to Admission medications   Medication Sig Start Date End Date Taking? Authorizing Provider  alendronate (FOSAMAX) 10 MG tablet Take 10 mg by mouth every 7 (seven) days. Take with a full glass of water on an empty stomach.    [provider]  ergocalciferol (VITAMIN D2) 50000 UNITS capsule Take 50,000 Units by mouth once a week.    [provider]  fluticasone (FLONASE) 50 MCG/ACT nasal spray Place 2 sprays into both nostrils daily.    [provider]  ranitidine (ZANTAC) 150 MG tablet Take 150 mg by mouth daily.    [provider]   Physical Exam: Vitals:   09/01/20 1106 09/01/20 1107 09/01/20 1509  BP: (!) 145/75  135/72  Pulse: 80  95  Resp: 17  15  Temp: (!) 97.5 F (36.4 C)    TempSrc: Oral    SpO2: 100%  98%  Weight:  68.9 kg   Height:  5\' 6"  (1.676 m)    Constitutional: appears age appropriate, NAD, calm, comfortable Eyes: PERRL, lids and conjunctivae normal ENMT: Mucous membranes are moist. Posterior pharynx clear of any exudate or lesions. Age-appropriate dentition. Hearing appropriate Neck: normal, supple, no masses, no thyromegaly Respiratory: clear to auscultation bilaterally, no wheezing, no crackles. Normal respiratory effort. No accessory muscle use.  Cardiovascular: Regular rate and rhythm, no murmurs / rubs / gallops. No extremity edema. 2+ pedal pulses. No carotid bruits.  Abdomen: no tenderness, no masses palpated, no hepatosplenomegaly. Bowel sounds positive.  Musculoskeletal: no clubbing / cyanosis. No joint deformity upper and lower extremities. Good ROM, no contractures, no atrophy. Normal muscle tone.  Skin: no rashes, lesions,  ulcers. No induration Neurologic: Sensation intact. Strength 5/5 in all 4.  Psychiatric: Normal judgment and insight. Alert and oriented x 3. Normal mood.   EKG: not indicated   Chest x-ray on Admission: I personally reviewed and I agree with radiologist reading as below.  CT ABDOMEN PELVIS WO CONTRAST  Result Date: 09/01/2020 CLINICAL DATA:  Epigastric pain since last night. Patient denies nausea, vomiting and diarrhea. Elevated liver function studies. Biliary obstruction suspected. EXAM: CT ABDOMEN AND PELVIS WITHOUT CONTRAST TECHNIQUE: Multidetector CT imaging of the abdomen and pelvis was performed following the standard protocol without IV contrast. COMPARISON:  None. FINDINGS: Lower chest: Mild linear atelectasis or scarring in the right lower lobe. The lung bases are otherwise clear. Trace pericardial fluid with mild coronary and aortic atherosclerosis. Hepatobiliary: No focal hepatic abnormalities are identified on noncontrast imaging. The gallbladder is incompletely distended. No evidence of gallstones, gallbladder wall thickening or pericholecystic fluid. There is mild intra and extrahepatic biliary dilatation with the common hepatic duct measuring 9 mm in diameter. No calcified intraductal calculi are visualized. Pancreas: No evidence of pancreatic head or ampullary mass. There is no pancreatic ductal dilatation, surrounding inflammation or fluid collection. Spleen: Normal in size without focal abnormality. Adrenals/Urinary Tract: Both adrenal glands  appear normal. There are 2 prominent water density cysts in the interpolar region of the left kidney measuring up to 4.1 cm in diameter. Posteriorly in the mid left kidney, there is a subcentimeter hyperdense lesion which is likely a small hemorrhagic cysts. No evidence of urinary tract calculus or hydronephrosis. The bladder appears normal. Stomach/Bowel: No enteric contrast administered. The stomach appears unremarkable for its degree of  distension. No evidence of bowel wall thickening, distention or surrounding inflammatory change. The appendix appears normal. Vascular/Lymphatic: There are no enlarged abdominal or pelvic lymph nodes. Aortic and branch vessel atherosclerosis without acute vascular findings on noncontrast imaging. Reproductive: There is a 5.6 x 4.5 cm left adnexal mass on image 60/2. This appears homogeneous in density and abuts the uterus, possibly reflecting an exophytic fibroid. The uterus otherwise appears normal. No right adnexal mass or pelvic inflammatory changes. Other: No evidence of abdominal wall mass or hernia. No ascites. Musculoskeletal: No acute or significant osseous findings. Lumbar spine facet arthropathy. IMPRESSION: 1. Mild intrahepatic and extrahepatic biliary dilatation of undetermined etiology. No calcified stones are identified. Consider right upper quadrant abdominal ultrasound or MRCP for further evaluation. 2. No evidence of pancreatic or ampullary lesion. No pancreatic ductal dilatation. 3. Indeterminate left adnexal lesion, potentially an exophytic fibroid. This is incompletely evaluated by this noncontrast study, and further evaluation with pelvic ultrasound recommended. 4.  Aortic Atherosclerosis (ICD10-I70.0). Electronically Signed   By: Richardean Sale M.D.   On: 09/01/2020 15:22   US Abdomen Limited RUQ (LIVER/GB)  Result Date: 09/01/2020 CLINICAL DATA:  Elevated bilirubin EXAM: ULTRASOUND ABDOMEN LIMITED RIGHT UPPER QUADRANT COMPARISON:  None. FINDINGS: Gallbladder: Gallbladder is decompressed with multiple stones within. No wall thickening or pericholecystic fluid is noted. Negative sonographic Murphy's sign is elicited. Common bile duct: Diameter: 7 mm Liver: No focal lesion identified. Within normal limits in parenchymal echogenicity. Portal vein is patent on color Doppler imaging with normal direction of blood flow towards the liver. Other: None. IMPRESSION: Multiple gallstones in a  decompressed gallbladder. No other focal abnormality is noted. Electronically Signed   By: Inez Catalina M.D.   On: 09/01/2020 16:41   Labs on Admission: I have personally reviewed following labs  CBC: Recent Labs  Lab 09/01/20 1214  WBC 9.6  NEUTROABS 8.7*  HGB 14.8  HCT 44.5  MCV 88.1  PLT 132   Basic Metabolic Panel: Recent Labs  Lab 09/01/20 1214  NA 136  K 3.8  CL 99  CO2 26  GLUCOSE 117*  BUN 14  CREATININE 0.82  CALCIUM 9.7   GFR: Estimated Creatinine Clearance: 59.8 mL/min (by C-G formula based on SCr of 0.82 mg/dL).  Liver Function Tests: Recent Labs  Lab 09/01/20 1214  AST 847*  ALT 717*  ALKPHOS 150*  BILITOT 4.5*  PROT 8.0  ALBUMIN 4.4   Recent Labs  Lab 09/01/20 1214  LIPASE 44   Urine analysis:    Component Value Date/Time   COLORURINE YELLOW (A) 09/01/2020 1310   APPEARANCEUR CLEAR (A) 09/01/2020 1310   LABSPEC 1.014 09/01/2020 1310   PHURINE 6.0 09/01/2020 1310   GLUCOSEU NEGATIVE 09/01/2020 1310   HGBUR NEGATIVE 09/01/2020 1310   [place] 09/01/2020 [place] 09/01/2020 1310   PROTEINUR NEGATIVE 09/01/2020 1310   NITRITE NEGATIVE 09/01/2020 1310   LEUKOCYTESUR NEGATIVE 09/01/2020 1310   Dusan Lipford N Zae Kirtz D.O. [facility]  If 7PM-7AM, please contact overnight-coverage provider If 7AM-7PM, please contact day coverage provider [email or web address]  09/01/2020, 5:25 PM

## 2020-09-01 NOTE — ED Provider Notes (Signed)
[facility] Emergency Department Provider Note   ____________________________________________   Event Date/Time   First MD Initiated Contact with Patient 09/01/20 1105     (approximate)  I have reviewed the triage vital signs and the nursing notes.   HISTORY  Chief Complaint Abdominal Pain    HPI Alexyia Domingue Coltrain is a 70 y.o. female with past medical history of hyperlipidemia, GERD, diabetes who presents to the ED complaining of abdominal pain.  Patient reports that she woke up this morning around 8 AM with pain in her epigastrium.  She describes it as a burning pain similar to prior episodes intermittently over the past couple of weeks.  She has been seen by her PCP for this problem and prescribed famotidine as well as sucralfate.  She took a dose of sucralfate this morning and pain has since resolved.  She denies any associated chest pain, shortness of breath, nausea, vomiting, dysuria, flank pain, or changes in bowel movements.  Patient reports being told that she has a hernia in her upper stomach previously, will schedule GI follow-up recently but canceled this as she was feeling better.        Past Medical History:  Diagnosis Date  . Breast cancer ([place]) 2006   LT MASTECTOMY  . Diabetes mellitus without complication (HCC)    borderline  . GERD (gastroesophageal reflux disease)   . Hyperlipidemia   . S/P chemotherapy, time since greater than 12 weeks 2006   BREAST CA  . Vitamin D deficiency     Patient Active Problem List   Diagnosis Date Noted  . Choledocholithiasis 09/01/2020  . Elevated liver enzymes 09/01/2020  . Hyperlipidemia, mixed 09/01/2020    Past Surgical History:  Procedure Laterality Date  . BREAST EXCISIONAL BIOPSY Right 2000?   EXCISIONAL - NEG  . COLONOSCOPY WITH PROPOFOL N/A 04/13/2015   Procedure: COLONOSCOPY WITH PROPOFOL;  Surgeon: Hulen Luster, MD;  Location: [facility] ENDOSCOPY;  Service: Gastroenterology;  Laterality:  N/A;  . MASTECTOMY Left 2006   breast ca    Prior to Admission medications   Medication Sig Start Date End Date Taking? Authorizing Provider  alendronate (FOSAMAX) 10 MG tablet Take 10 mg by mouth every 7 (seven) days. Take with a full glass of water on an empty stomach.    [provider]  ergocalciferol (VITAMIN D2) 50000 UNITS capsule Take 50,000 Units by mouth once a week.    [provider]  fluticasone (FLONASE) 50 MCG/ACT nasal spray Place 2 sprays into both nostrils daily.    [provider]  ranitidine (ZANTAC) 150 MG tablet Take 150 mg by mouth daily.    [provider]    Allergies Aspirin  Family History  Problem Relation Age of Onset  . Breast cancer Sister 71    Social History Social History   Tobacco Use  . Smoking status: Former Smoker    Packs/day: 1.00    Years: 10.00    Pack years: 10.00    Types: Cigarettes  . Smokeless tobacco: Never Used  Substance Use Topics  . Alcohol use: Never  . Drug use: No    Review of Systems  Constitutional: No fever/chills Eyes: No visual changes. ENT: No sore throat. Cardiovascular: Denies chest pain. Respiratory: Denies shortness of breath. Gastrointestinal: Positive for abdominal pain.  No nausea, no vomiting.  No diarrhea.  No constipation. Genitourinary: Negative for dysuria. Musculoskeletal: Negative for back pain. Skin: Negative for rash. Neurological: Negative for headaches, focal weakness or  numbness.  ____________________________________________   PHYSICAL EXAM:  VITAL SIGNS: ED Triage Vitals  Enc Vitals Group     BP 09/01/20 1106 (!) 145/75     Pulse Rate 09/01/20 1106 80     Resp 09/01/20 1106 17     Temp 09/01/20 1106 (!) 97.5 F (36.4 C)     Temp Source 09/01/20 1106 Oral     SpO2 09/01/20 1106 100 %     Weight 09/01/20 1107 152 lb (68.9 kg)     Height 09/01/20 1107 5\' 6"  (1.676 m)     Head Circumference --      Peak Flow --      Pain Score 09/01/20  1107 9     Pain Loc --      Pain Edu? --      Excl. in [place]? --     Constitutional: Alert and oriented. Eyes: Conjunctivae are normal. Head: Atraumatic. Nose: No congestion/rhinnorhea. Mouth/Throat: Mucous membranes are moist. Neck: Normal ROM Cardiovascular: Normal rate, regular rhythm. Grossly normal heart sounds. Respiratory: Normal respiratory effort.  No retractions. Lungs CTAB. Gastrointestinal: Soft and nontender. No distention. Genitourinary: deferred Musculoskeletal: No lower extremity tenderness nor edema. Neurologic:  Normal speech and language. No gross focal neurologic deficits are appreciated. Skin:  Skin is warm, dry and intact. No rash noted. Psychiatric: Mood and affect are normal. Speech and behavior are normal.  ____________________________________________   LABS (all labs ordered are listed, but only abnormal results are displayed)  Labs Reviewed  CBC WITH DIFFERENTIAL/PLATELET - Abnormal; Notable for the following components:      Result Value   Neutro Abs 8.7 (*)    Lymphs Abs 0.5 (*)    All other components within normal limits  COMPREHENSIVE METABOLIC PANEL - Abnormal; Notable for the following components:   Glucose, Bld 117 (*)    AST 847 (*)    ALT 717 (*)    Alkaline Phosphatase 150 (*)    Total Bilirubin 4.5 (*)    All other components within normal limits  URINALYSIS, COMPLETE (UACMP) WITH MICROSCOPIC - Abnormal; Notable for the following components:   Color, Urine YELLOW (*)    APPearance CLEAR (*)    All other components within normal limits  RESP PANEL BY RT-PCR (FLU A&B, COVID) ARPGX2  LIPASE, BLOOD  HIV ANTIBODY (ROUTINE TESTING W REFLEX)  BASIC METABOLIC PANEL  CBC   ____________________________________________  EKG  ED ECG REPORT I, Blake Divine, the attending physician, personally viewed and interpreted this ECG.   Date: 09/01/2020  EKG Time: 12:35  Rate: 78  Rhythm: normal sinus rhythm  Axis: Normal  Intervals:none   ST&T Change: None   PROCEDURES  Procedure(s) performed (including Critical Care):  Procedures   ____________________________________________   INITIAL IMPRESSION / ASSESSMENT AND PLAN / ED COURSE       70 year old female with past medical history of hyperlipidemia, diabetes, and GERD who presents to the ED with episode of burning pain in her epigastrium when she woke up this morning that has since resolved following a dose of sucralfate.  She has no tenderness on exam and denies any pain moving up into her chest.  We will screen EKG and labs including LFTs and lipase, however symptoms sound most consistent with gastritis/GERD.  What she is describing is likely a hiatal hernia as there is no evidence of hernia on exam.  If screening labs are unremarkable, patient will be appropriate for discharge home with GI follow-up.  Labs remarkable for elevated transaminases  along with alkaline phosphatase and bilirubin.  Findings are concerning for obstructive biliary pathology, CT scan was performed and does show mildly increased CBD diameter at 9 mm.  There are no findings to suggest cholecystitis.  This was further assessed with right upper quadrant ultrasound, which shows significant quantity of gallstones with CBD of 7 mm.  Findings discussed with Dr. Marius Ditch of GI, who recommends proceeding with MRCP and states ERCP will be available if patient were to require it.  No findings to suggest cholangitis at this time, she has minimal pain and no leukocytosis.  Case discussed with hospitalist for admission.      ____________________________________________   FINAL CLINICAL IMPRESSION(S) / ED DIAGNOSES  Final diagnoses:  Elevated bilirubin  Choledocholithiasis     ED Discharge Orders    None       Note:  This document was prepared using Dragon voice recognition software and may include unintentional dictation errors.   Blake Divine, MD 09/01/20 [id number]

## 2020-09-02 ENCOUNTER — Encounter: Payer: Self-pay | Admitting: Internal Medicine

## 2020-09-02 ENCOUNTER — Observation Stay: Payer: Medicare HMO

## 2020-09-02 DIAGNOSIS — K8020 Calculus of gallbladder without cholecystitis without obstruction: Secondary | ICD-10-CM | POA: Diagnosis not present

## 2020-09-02 DIAGNOSIS — K8050 Calculus of bile duct without cholangitis or cholecystitis without obstruction: Secondary | ICD-10-CM | POA: Diagnosis not present

## 2020-09-02 DIAGNOSIS — K8070 Calculus of gallbladder and bile duct without cholecystitis without obstruction: Secondary | ICD-10-CM | POA: Diagnosis not present

## 2020-09-02 DIAGNOSIS — R748 Abnormal levels of other serum enzymes: Secondary | ICD-10-CM

## 2020-09-02 LAB — CBC
HCT: 37.4 % (ref 36.0–46.0)
Hemoglobin: 12.3 g/dL (ref 12.0–15.0)
MCH: 28.5 pg (ref 26.0–34.0)
MCHC: 32.9 g/dL (ref 30.0–36.0)
MCV: 86.8 fL (ref 80.0–100.0)
Platelets: 308 10*3/uL (ref 150–400)
RBC: 4.31 MIL/uL (ref 3.87–5.11)
RDW: 14.3 % (ref 11.5–15.5)
WBC: 5.8 10*3/uL (ref 4.0–10.5)
nRBC: 0 % (ref 0.0–0.2)

## 2020-09-02 LAB — HEPATIC FUNCTION PANEL
ALT: 554 U/L — ABNORMAL HIGH (ref 0–44)
AST: 343 U/L — ABNORMAL HIGH (ref 15–41)
Albumin: 3.8 g/dL (ref 3.5–5.0)
Alkaline Phosphatase: 160 U/L — ABNORMAL HIGH (ref 38–126)
Bilirubin, Direct: 1.4 mg/dL — ABNORMAL HIGH (ref 0.0–0.2)
Indirect Bilirubin: 3.8 mg/dL — ABNORMAL HIGH (ref 0.3–0.9)
Total Bilirubin: 5.2 mg/dL — ABNORMAL HIGH (ref 0.3–1.2)
Total Protein: 7.1 g/dL (ref 6.5–8.1)

## 2020-09-02 LAB — BASIC METABOLIC PANEL
Anion gap: 8 (ref 5–15)
BUN: 14 mg/dL (ref 8–23)
CO2: 24 mmol/L (ref 22–32)
Calcium: 8.9 mg/dL (ref 8.9–10.3)
Chloride: 101 mmol/L (ref 98–111)
Creatinine, Ser: 0.92 mg/dL (ref 0.44–1.00)
GFR, Estimated: >60 mL/min (ref >60)
Glucose, Bld: 101 mg/dL — ABNORMAL HIGH (ref 70–99)
Potassium: 3.2 mmol/L — ABNORMAL LOW (ref 3.5–5.1)
Sodium: 133 mmol/L — ABNORMAL LOW (ref 135–145)

## 2020-09-02 LAB — HIV ANTIBODY (ROUTINE TESTING W REFLEX): HIV Screen 4th Generation wRfx: NONREACTIVE

## 2020-09-02 LAB — MAGNESIUM: Magnesium: 2.1 mg/dL (ref 1.7–2.4)

## 2020-09-02 LAB — PHOSPHORUS: Phosphorus: 4.3 mg/dL (ref 2.5–4.6)

## 2020-09-02 MED ORDER — GADOBUTROL 1 MMOL/ML IV SOLN
6.0000 mL | Freq: Once | INTRAVENOUS | Status: AC | PRN
  Administered 2020-09-02: 6 mL via INTRAVENOUS

## 2020-09-02 MED ORDER — POTASSIUM CHLORIDE CRYS ER 20 MEQ PO TBCR
40.0000 meq | EXTENDED_RELEASE_TABLET | Freq: Once | ORAL | Status: AC
  Administered 2020-09-02: 40 meq via ORAL
  Filled 2020-09-02: qty 2

## 2020-09-02 NOTE — Consult Note (Signed)
Cephas Darby, MD [address]  [place]  [place], [place] [address]  Main: [phone]  Fax: [phone] Pager: [phone]   Consultation  Referring Provider:     No ref. provider found Primary Care Physician:  Dion Body, MD Primary Gastroenterologist: Althia Forts      Reason for Consultation:     Elevated bilirubin  Date of Admission:  09/01/2020 Date of Consultation:  09/02/2020         HPI:   Candice Leblanc is a 70 y.o. female with history of diabetes, history of breast cancer status postmastectomy presented yesterday with epigastric pain.  Apparently, patient has been having burning pain for last 2 weeks, and she was treated as heartburn by her PCP with famotidine and sucralfate.  Because the pain was severe, patient came to ER yesterday morning.  Patient denied any nausea, vomiting, fever, chills, patient was hemodynamically stable, labs revealed normal CBC, lipase, LFTs were significantly elevated alkaline phosphatase 150, AST 847, ALT 717, total bilirubin 4.5.  Patient initially underwent CT abdomen and pelvis without contrast, revealed mild intrahepatic and extrahepatic biliary dilation.  Subsequently, underwent ultrasound which revealed cholelithiasis.  Because of elevated bilirubin, I recommended MRCP.  MRCP confirmed choledocholithiasis, multiple filling defects within the common bile duct over several centimeters leading up to ampulla.  As well as cholelithiasis  Patient is currently asymptomatic, doing well, denies any symptoms As there is no evidence of cholangitis, patient is not started on antibiotics.  She is currently on regular diet  NSAIDs: None  Antiplts/Anticoagulants/Anti thrombotics: None  GI Procedures: Colonoscopy in 2016 - Multiple diminutive polyps in the descending colon, in the transverse colon and at the hepatic flexure. These may be peudopolyps. Incomplete resection. Resected tissue retrieved. DIAGNOSIS:  A. COLON POLYPS  NEAR HEPATIC FLEXURE X 5; COLD SNARE:  - ONE FRAGMENT OF COLONIC MUCOSA, NEGATIVE FOR HYPERPLASTIC CHANGES,  DYSPLASIA AND MALIGNANCY (ADDITIONAL DEEPER SECTIONS WERE REVIEWED).   Past Medical History:  Diagnosis Date  . Breast cancer ([place]) 2006   LT MASTECTOMY  . Diabetes mellitus without complication (HCC)    borderline  . GERD (gastroesophageal reflux disease)   . Hyperlipidemia   . S/P chemotherapy, time since greater than 12 weeks 2006   BREAST CA  . Vitamin D deficiency     Past Surgical History:  Procedure Laterality Date  . BREAST EXCISIONAL BIOPSY Right 2000?   EXCISIONAL - NEG  . COLONOSCOPY WITH PROPOFOL N/A 04/13/2015   Procedure: COLONOSCOPY WITH PROPOFOL;  Surgeon: Hulen Luster, MD;  Location: [facility] ENDOSCOPY;  Service: Gastroenterology;  Laterality: N/A;  . MASTECTOMY Left 2006   breast ca    Prior to Admission medications   Medication Sig Start Date End Date Taking? Authorizing Provider  Calcium Carb-Cholecalciferol 600-400 MG-UNIT TABS Take 1 tablet by mouth in the morning and at bedtime.   Yes [provider]  famotidine (PEPCID) 40 MG tablet Take 40 mg by mouth 2 (two) times daily.   Yes [provider]  fluticasone (FLONASE) 50 MCG/ACT nasal spray Place 2 sprays into both nostrils daily.   Yes [provider]  sucralfate (CARAFATE) 1 g tablet Take 1 g by mouth 4 (four) times daily. 07/17/20  Yes [provider]  alendronate (FOSAMAX) 10 MG tablet Take 10 mg by mouth every 7 (seven) days. Take with a full glass of water on an empty stomach. Patient not taking: Reported on 09/01/2020    [provider]  ergocalciferol (VITAMIN  D2) 50000 UNITS capsule Take 50,000 Units by mouth once a week. Patient not taking: Reported on 09/01/2020    [provider]  ranitidine (ZANTAC) 150 MG tablet Take 150 mg by mouth daily. Patient not taking: Reported on 09/01/2020    [provider]    Current  Facility-Administered Medications:  .  famotidine (PEPCID) tablet 40 mg, 40 mg, Oral, BID, Cox, Amy N, DO, 40 mg at 09/02/20 0830 .  fluticasone (FLONASE) 50 MCG/ACT nasal spray 2 spray, 2 spray, Each Nare, Daily, Cox, Amy N, DO, 2 spray at 09/02/20 0830 .  ibuprofen (ADVIL) tablet 600 mg, 600 mg, Oral, Q6H PRN, Cox, Amy N, DO .  morphine 2 MG/ML injection 1 mg, 1 mg, Intravenous, Q4H PRN, Cox, Amy N, DO .  ondansetron (ZOFRAN) tablet 4 mg, 4 mg, Oral, Q6H PRN **OR** ondansetron (ZOFRAN) injection 4 mg, 4 mg, Intravenous, Q6H PRN, Cox, Amy N, DO  Family History  Problem Relation Age of Onset  . Breast cancer Sister 49     Social History   Tobacco Use  . Smoking status: Former Smoker    Packs/day: 1.00    Years: 10.00    Pack years: 10.00    Types: Cigarettes  . Smokeless tobacco: Never Used  Substance Use Topics  . Alcohol use: Never  . Drug use: No    Allergies as of 09/01/2020 - Review Complete 09/01/2020  Allergen Reaction Noted  . Aspirin Other (See Comments) 04/10/2015  . Other Other (See Comments) 06/09/2014    Review of Systems:    All systems reviewed and negative except where noted in HPI.   Physical Exam:  Vital signs in last 24 hours: Temp:  [97.9 F (36.6 C)-99.1 F (37.3 C)] 97.9 F (36.6 C) (05/11 1059) Pulse Rate:  [65-99] 65 (05/11 1059) Resp:  [15-18] 18 (05/11 1059) BP: (100-135)/(55-115) 119/62 (05/11 1059) SpO2:  [97 %-100 %] 99 % (05/11 1059) Last BM Date: 09/01/20 General:   Pleasant, cooperative in NAD Head:  Normocephalic and atraumatic. Eyes:   No icterus.   Conjunctiva pink. PERRLA. Ears:  Normal auditory acuity. Neck:  Supple; no masses or thyroidomegaly Lungs: Respirations even and unlabored. Lungs clear to auscultation bilaterally.   No wheezes, crackles, or rhonchi.  Heart:  Regular rate and rhythm;  Without murmur, clicks, rubs or gallops Abdomen:  Soft, nondistended, nontender. Normal bowel sounds. No appreciable masses or  hepatomegaly.  No rebound or guarding.  Rectal:  Not performed. Msk:  Symmetrical without gross deformities.  Strength normal Extremities:  Without edema, cyanosis or clubbing. Neurologic:  Alert and oriented x3;  grossly normal neurologically. Skin:  Intact without significant lesions or rashes. Cervical Nodes:  No significant cervical adenopathy. Psych:  Alert and cooperative. Normal affect.  LAB RESULTS: CBC Latest Ref Rng & Units 09/02/2020 09/01/2020 04/08/2014  WBC 4.0 - 10.5 K/uL 5.8 9.6 8.2  Hemoglobin 12.0 - 15.0 g/dL 12.3 14.8 12.7  Hematocrit 36.0 - 46.0 % 37.4 44.5 39.8  Platelets 150 - 400 K/uL 308 281 321    BMET BMP Latest Ref Rng & Units 09/02/2020 09/01/2020 04/08/2014  Glucose 70 - 99 mg/dL 101(H) 117(H) 156(H)  BUN 8 - 23 mg/dL 14 14 6(L)  Creatinine 0.44 - 1.00 mg/dL 0.92 0.82 1.01  Sodium 135 - 145 mmol/L 133(L) 136 137  Potassium 3.5 - 5.1 mmol/L 3.2(L) 3.8 3.3(L)  Chloride 98 - 111 mmol/L 101 99 100  CO2 22 - 32 mmol/L _0 Calcium  8.9 - 10.3 mg/dL 8.9 9.7 8.9    LFT Hepatic Function Latest Ref Rng & Units 09/02/2020 09/01/2020 04/08/2014  Total Protein 6.5 - 8.1 g/dL 7.1 8.0 7.7  Albumin 3.5 - 5.0 g/dL 3.8 4.4 4.1  AST 15 - 41 U/L 343(H) 847(H) 13(L)  ALT 0 - 44 U/L 554(H) 717(H) 35  Alk Phosphatase 38 - 126 U/L 160(H) 150(H) 52  Total Bilirubin 0.3 - 1.2 mg/dL 5.2(H) 4.5(H) 0.6  Bilirubin, Direct 0.0 - 0.2 mg/dL 1.4(H) - -     STUDIES: CT ABDOMEN PELVIS WO CONTRAST  Result Date: 09/01/2020 CLINICAL DATA:  Epigastric pain since last night. Patient denies nausea, vomiting and diarrhea. Elevated liver function studies. Biliary obstruction suspected. EXAM: CT ABDOMEN AND PELVIS WITHOUT CONTRAST TECHNIQUE: Multidetector CT imaging of the abdomen and pelvis was performed following the standard protocol without IV contrast. COMPARISON:  None. FINDINGS: Lower chest: Mild linear atelectasis or scarring in the right lower lobe. The lung bases are otherwise  clear. Trace pericardial fluid with mild coronary and aortic atherosclerosis. Hepatobiliary: No focal hepatic abnormalities are identified on noncontrast imaging. The gallbladder is incompletely distended. No evidence of gallstones, gallbladder wall thickening or pericholecystic fluid. There is mild intra and extrahepatic biliary dilatation with the common hepatic duct measuring 9 mm in diameter. No calcified intraductal calculi are visualized. Pancreas: No evidence of pancreatic head or ampullary mass. There is no pancreatic ductal dilatation, surrounding inflammation or fluid collection. Spleen: Normal in size without focal abnormality. Adrenals/Urinary Tract: Both adrenal glands appear normal. There are 2 prominent water density cysts in the interpolar region of the left kidney measuring up to 4.1 cm in diameter. Posteriorly in the mid left kidney, there is a subcentimeter hyperdense lesion which is likely a small hemorrhagic cysts. No evidence of urinary tract calculus or hydronephrosis. The bladder appears normal. Stomach/Bowel: No enteric contrast administered. The stomach appears unremarkable for its degree of distension. No evidence of bowel wall thickening, distention or surrounding inflammatory change. The appendix appears normal. Vascular/Lymphatic: There are no enlarged abdominal or pelvic lymph nodes. Aortic and branch vessel atherosclerosis without acute vascular findings on noncontrast imaging. Reproductive: There is a 5.6 x 4.5 cm left adnexal mass on image 60/2. This appears homogeneous in density and abuts the uterus, possibly reflecting an exophytic fibroid. The uterus otherwise appears normal. No right adnexal mass or pelvic inflammatory changes. Other: No evidence of abdominal wall mass or hernia. No ascites. Musculoskeletal: No acute or significant osseous findings. Lumbar spine facet arthropathy. IMPRESSION: 1. Mild intrahepatic and extrahepatic biliary dilatation of undetermined etiology. No  calcified stones are identified. Consider right upper quadrant abdominal ultrasound or MRCP for further evaluation. 2. No evidence of pancreatic or ampullary lesion. No pancreatic ductal dilatation. 3. Indeterminate left adnexal lesion, potentially an exophytic fibroid. This is incompletely evaluated by this noncontrast study, and further evaluation with pelvic ultrasound recommended. 4.  Aortic Atherosclerosis (ICD10-I70.0). Electronically Signed   By: Richardean Sale M.D.   On: 09/01/2020 15:22   MR 3D Recon At Scanner  Result Date: 09/02/2020 CLINICAL DATA:  70 year old female with acute onset epigastric pain and burning. Gallstone noted on ultrasound. EXAM: MRI ABDOMEN WITHOUT AND WITH CONTRAST (INCLUDING MRCP) TECHNIQUE: Multiplanar multisequence MR imaging of the abdomen was performed both before and after the administration of intravenous contrast. Heavily T2-weighted images of the biliary and pancreatic ducts were obtained, and three-dimensional MRCP images were rendered by post processing. CONTRAST:  29m GADAVIST GADOBUTROL 1 MMOL/ML IV SOLN COMPARISON:  CT  09/01/2020 ultrasound 22 FINDINGS: Lower chest:  Lung bases are clear. Hepatobiliary: Nosignificant intrahepatic duct dilatation. The common hepatic duct and common bile ducts are mildly dilated with the common bile duct measuring 9 mm. There are multiple filling defects within the common bile duct over several cm leading up to the ampulla. Filling defects are well seen on coronal image 16/series 3. The gallbladder is contracted around multiple small stones similar to the stones in the common bile duct. Stones measure approximately 2-3 mm each. No focal hepatic lesion. Pancreas: Normal pancreatic parenchymal intensity. No ductal dilatation or inflammation. Spleen: Normal spleen. Adrenals/urinary tract: Adrenal glands normal. Benign-appearing cyst of the RIGHT kidney. Stomach/Bowel: Stomach and limited of the small bowel is unremarkable  Vascular/Lymphatic: Abdominal aortic normal caliber. No retroperitoneal periportal lymphadenopathy. Musculoskeletal: No aggressive osseous lesion IMPRESSION: 1. Multiple stones within the common bile duct (choledocholithiasis). Minimal intrahepatic duct dilatation. 2. Gallbladder contracted around multiple gallstones. 3. No evidence of pancreatitis. Electronically Signed   By: Suzy Bouchard M.D.   On: 09/02/2020 07:45   MR ABDOMEN MRCP W WO CONTAST  Result Date: 09/02/2020 CLINICAL DATA:  70 year old female with acute onset epigastric pain and burning. Gallstone noted on ultrasound. EXAM: MRI ABDOMEN WITHOUT AND WITH CONTRAST (INCLUDING MRCP) TECHNIQUE: Multiplanar multisequence MR imaging of the abdomen was performed both before and after the administration of intravenous contrast. Heavily T2-weighted images of the biliary and pancreatic ducts were obtained, and three-dimensional MRCP images were rendered by post processing. CONTRAST:  87m GADAVIST GADOBUTROL 1 MMOL/ML IV SOLN COMPARISON:  CT 09/01/2020 ultrasound 22 FINDINGS: Lower chest:  Lung bases are clear. Hepatobiliary: Nosignificant intrahepatic duct dilatation. The common hepatic duct and common bile ducts are mildly dilated with the common bile duct measuring 9 mm. There are multiple filling defects within the common bile duct over several cm leading up to the ampulla. Filling defects are well seen on coronal image 16/series 3. The gallbladder is contracted around multiple small stones similar to the stones in the common bile duct. Stones measure approximately 2-3 mm each. No focal hepatic lesion. Pancreas: Normal pancreatic parenchymal intensity. No ductal dilatation or inflammation. Spleen: Normal spleen. Adrenals/urinary tract: Adrenal glands normal. Benign-appearing cyst of the RIGHT kidney. Stomach/Bowel: Stomach and limited of the small bowel is unremarkable Vascular/Lymphatic: Abdominal aortic normal caliber. No retroperitoneal periportal  lymphadenopathy. Musculoskeletal: No aggressive osseous lesion IMPRESSION: 1. Multiple stones within the common bile duct (choledocholithiasis). Minimal intrahepatic duct dilatation. 2. Gallbladder contracted around multiple gallstones. 3. No evidence of pancreatitis. Electronically Signed   By: SSuzy BouchardM.D.   On: 09/02/2020 07:45   UKoreaAbdomen Limited RUQ (LIVER/GB)  Result Date: 09/01/2020 CLINICAL DATA:  Elevated bilirubin EXAM: ULTRASOUND ABDOMEN LIMITED RIGHT UPPER QUADRANT COMPARISON:  None. FINDINGS: Gallbladder: Gallbladder is decompressed with multiple stones within. No wall thickening or pericholecystic fluid is noted. Negative sonographic Murphy's sign is elicited. Common bile duct: Diameter: 7 mm Liver: No focal lesion identified. Within normal limits in parenchymal echogenicity. Portal vein is patent on color Doppler imaging with normal direction of blood flow towards the liver. Other: None. IMPRESSION: Multiple gallstones in a decompressed gallbladder. No other focal abnormality is noted. Electronically Signed   By: MInez CatalinaM.D.   On: 09/01/2020 16:41      Impression / Plan:   CEstalee Mccandlishis a 70y.o. female with history of diabetes, is admitted with epigastric and right upper quadrant pain, LFTs revealed obstructive pattern, imaging confirmed choledocholithiasis  Choledocholithiasis No evidence of gallstone  pancreatitis or ascending cholangitis Transaminases are improving, total bilirubin is slightly elevated Patient has been afebrile since admission with no evidence of leukocytosis Okay to defer antibiotics at this time unless patient spikes fever Please switch from solids to clear liquids only Maintenance IV fluids N.p.o. effective 5 AM tomorrow Plan for ERCP tomorrow by Dr. Allen Norris Recommend to consult general surgery for cholecystectomy  Thank you for involving me in the care of this patient.      LOS: 0 days   Sherri Sear, MD  09/02/2020, 12:12  PM   Note: This dictation was prepared with Dragon dictation along with smaller phrase technology. Any transcriptional errors that result from this process are unintentional.

## 2020-09-02 NOTE — Progress Notes (Addendum)
Progress Note    Candice Leblanc  CVE:[id number] DOB: July 22, 1950  DOA: 09/01/2020 PCP: Dion Body, MD      Brief Narrative:    Medical records reviewed and are as summarized below:  Candice Leblanc is a 69 y.o. female with past medical history significant for hyperlipidemia, GERD, borderline diabetes mellitus, remote history of breast cancer status post left mastectomy and chemotherapy in 2006.  She presented to the hospital because of nausea and abdominal pain.  Work-up revealed choledocholithiasis, cholelithiasis and elevated liver enzymes.    Assessment/Plan:   Principal Problem:   Choledocholithiasis Active Problems:   Elevated liver enzymes   Hyperlipidemia, mixed    Body mass index is 24.53 kg/m.   Abdominal pain, choledocholithiasis, cholelithiasis, elevated liver enzymes: No evidence of pancreatitis, cholecystitis or cholangitis.  Plan for ERCP tomorrow.  Consulted general surgery to assist with management.  Surgeon plans to perform cholecystectomy prior to discharge.  Repeat liver enzymes tomorrow.  Hypokalemia: Replete potassium and monitor levels.  Check magnesium level.  First-degree AV block on telemetry.  Continue to monitor.  Other comorbidities include hyperlipidemia, GERD, borderline diabetes mellitus, remote history of breast cancer status post left mastectomy in 2006   Diet Order            Diet NPO time specified Except for: Ice Chips, Sips with Meds  Diet effective 0500 tomorrow           Diet clear liquid Room service appropriate? Yes; Fluid consistency: Thin  Diet effective now                    Consultants:  Gastroenterologist  General surgeon  Procedures:  None    Medications:   . famotidine  40 mg Oral BID  . fluticasone  2 spray Each Nare Daily   Continuous Infusions:   Anti-infectives (From admission, onward)   None             Family Communication/Anticipated D/C date and  plan/Code Status   DVT prophylaxis: Place TED hose Start: 09/01/20 1712     Code Status: Full Code  Family Communication: None Disposition Plan:    Status is: Observation  The patient will require care spanning > 2 midnights and should be moved to inpatient because: Ongoing diagnostic testing needed not appropriate for outpatient work up and Inpatient level of care appropriate due to severity of illness  Dispo: The patient is from: Home              Anticipated d/c is to: Home              Patient currently is not medically stable to d/c.   Difficult to place patient No           Subjective:   Abdominal pain is better.  No nausea or vomiting.  Objective:    Vitals:   09/01/20 2327 09/02/20 0449 09/02/20 0739 09/02/20 1059  BP: (!) 111/57 (!) 100/55 (!) 113/55 119/62  Pulse: 78 74 71 65  Resp: 15 16 16 18   Temp: 99.1 F (37.3 C) 98.9 F (37.2 C) 98.1 F (36.7 C) 97.9 F (36.6 C)  TempSrc:    Oral  SpO2: 98% 97% 98% 99%  Weight:      Height:       No data found.   Intake/Output Summary (Last 24 hours) at 09/02/2020 1515 Last data filed at 09/02/2020 1406 Gross per 24 hour  Intake 1560 ml  Output --  Net 1560 ml   Filed Weights   09/01/20 1107  Weight: 68.9 kg    Exam:  GEN: NAD SKIN: Warm and dry EYES: EOMI ENT: MMM CV: RRR PULM: CTA B ABD: soft, ND, NT, +BS CNS: AAO x 3, non focal EXT: No edema or tenderness        Data Reviewed:   I have personally reviewed following labs and imaging studies:  Labs: Labs show the following:   Basic Metabolic Panel: Recent Labs  Lab 09/01/20 1214 09/02/20 0315  NA 136 133*  K 3.8 3.2*  CL 99 101  CO2 26 24  GLUCOSE 117* 101*  BUN 14 14  CREATININE 0.82 0.92  CALCIUM 9.7 8.9  MG  --  2.1  PHOS  --  4.3   GFR Estimated Creatinine Clearance: 53.3 mL/min (by C-G formula based on SCr of 0.92 mg/dL). Liver Function Tests: Recent Labs  Lab 09/01/20 1214 09/02/20 0315  AST 847* 343*   ALT 717* 554*  ALKPHOS 150* 160*  BILITOT 4.5* 5.2*  PROT 8.0 7.1  ALBUMIN 4.4 3.8   Recent Labs  Lab 09/01/20 1214  LIPASE 44   No results for input(s): AMMONIA in the last 168 hours. Coagulation profile No results for input(s): INR, PROTIME in the last 168 hours.  CBC: Recent Labs  Lab 09/01/20 1214 09/02/20 0315  WBC 9.6 5.8  NEUTROABS 8.7*  --   HGB 14.8 12.3  HCT 44.5 37.4  MCV 88.1 86.8  PLT 281 308   Cardiac Enzymes: No results for input(s): CKTOTAL, CKMB, CKMBINDEX, TROPONINI in the last 168 hours. BNP (last 3 results) No results for input(s): PROBNP in the last 8760 hours. CBG: No results for input(s): GLUCAP in the last 168 hours. D-Dimer: No results for input(s): DDIMER in the last 72 hours. Hgb A1c: No results for input(s): HGBA1C in the last 72 hours. Lipid Profile: No results for input(s): CHOL, HDL, LDLCALC, TRIG, CHOLHDL, LDLDIRECT in the last 72 hours. Thyroid function studies: No results for input(s): TSH, T4TOTAL, T3FREE, THYROIDAB in the last 72 hours.  Invalid input(s): FREET3 Anemia work up: No results for input(s): VITAMINB12, FOLATE, FERRITIN, TIBC, IRON, RETICCTPCT in the last 72 hours. Sepsis Labs: Recent Labs  Lab 09/01/20 1214 09/02/20 0315  WBC 9.6 5.8    Microbiology Recent Results (from the past 240 hour(s))  Resp Panel by RT-PCR (Flu A&B, Covid) Nasopharyngeal Swab     Status: None   Collection Time: 09/01/20  4:53 PM   Specimen: Nasopharyngeal Swab; Nasopharyngeal(NP) swabs in vial transport medium  Result Value Ref Range Status   SARS Coronavirus 2 by RT PCR NEGATIVE NEGATIVE Final    Comment: (NOTE) SARS-CoV-2 target nucleic acids are NOT DETECTED.  The SARS-CoV-2 RNA is generally detectable in upper respiratory specimens during the acute phase of infection. The lowest concentration of SARS-CoV-2 viral copies this assay can detect is 138 copies/mL. A negative result does not preclude SARS-Cov-2 infection and  should not be used as the sole basis for treatment or other patient management decisions. A negative result may occur with  improper specimen collection/handling, submission of specimen other than nasopharyngeal swab, presence of viral mutation(s) within the areas targeted by this assay, and inadequate number of viral copies(<138 copies/mL). A negative result must be combined with clinical observations, patient history, and epidemiological information. The expected result is Negative.  Fact Sheet for Patients:  EntrepreneurPulse.com.au  Fact Sheet for Healthcare Providers:  IncredibleEmployment.be  This test is  no t yet approved or cleared by the [place] and  has been authorized for detection and/or diagnosis of SARS-CoV-2 by FDA under an Emergency Use Authorization (EUA). This EUA will remain  in effect (meaning this test can be used) for the duration of the COVID-19 declaration under Section 564(b)(1) of the Act, 21 U.S.C.section 360bbb-3(b)(1), unless the authorization is terminated  or revoked sooner.       Influenza A by PCR NEGATIVE NEGATIVE Final   Influenza B by PCR NEGATIVE NEGATIVE Final    Comment: (NOTE) The Xpert Xpress SARS-CoV-2/FLU/RSV plus assay is intended as an aid in the diagnosis of influenza from Nasopharyngeal swab specimens and should not be used as a sole basis for treatment. Nasal washings and aspirates are unacceptable for Xpert Xpress SARS-CoV-2/FLU/RSV testing.  Fact Sheet for Patients: EntrepreneurPulse.com.au  Fact Sheet for Healthcare Providers: IncredibleEmployment.be  This test is not yet approved or cleared by the [place] FDA and has been authorized for detection and/or diagnosis of SARS-CoV-2 by FDA under an Emergency Use Authorization (EUA). This EUA will remain in effect (meaning this test can be used) for the duration of the COVID-19 declaration  under Section 564(b)(1) of the Act, 21 U.S.C. section 360bbb-3(b)(1), unless the authorization is terminated or revoked.  Performed at [facility], [address] [place], [place] [address]     Procedures and diagnostic studies:  CT ABDOMEN PELVIS WO CONTRAST  Result Date: 09/01/2020 CLINICAL DATA:  Epigastric pain since last night. Patient denies nausea, vomiting and diarrhea. Elevated liver function studies. Biliary obstruction suspected. EXAM: CT ABDOMEN AND PELVIS WITHOUT CONTRAST TECHNIQUE: Multidetector CT imaging of the abdomen and pelvis was performed following the standard protocol without IV contrast. COMPARISON:  None. FINDINGS: Lower chest: Mild linear atelectasis or scarring in the right lower lobe. The lung bases are otherwise clear. Trace pericardial fluid with mild coronary and aortic atherosclerosis. Hepatobiliary: No focal hepatic abnormalities are identified on noncontrast imaging. The gallbladder is incompletely distended. No evidence of gallstones, gallbladder wall thickening or pericholecystic fluid. There is mild intra and extrahepatic biliary dilatation with the common hepatic duct measuring 9 mm in diameter. No calcified intraductal calculi are visualized. Pancreas: No evidence of pancreatic head or ampullary mass. There is no pancreatic ductal dilatation, surrounding inflammation or fluid collection. Spleen: Normal in size without focal abnormality. Adrenals/Urinary Tract: Both adrenal glands appear normal. There are 2 prominent water density cysts in the interpolar region of the left kidney measuring up to 4.1 cm in diameter. Posteriorly in the mid left kidney, there is a subcentimeter hyperdense lesion which is likely a small hemorrhagic cysts. No evidence of urinary tract calculus or hydronephrosis. The bladder appears normal. Stomach/Bowel: No enteric contrast administered. The stomach appears unremarkable for its degree of distension. No evidence of bowel wall  thickening, distention or surrounding inflammatory change. The appendix appears normal. Vascular/Lymphatic: There are no enlarged abdominal or pelvic lymph nodes. Aortic and branch vessel atherosclerosis without acute vascular findings on noncontrast imaging. Reproductive: There is a 5.6 x 4.5 cm left adnexal mass on image 60/2. This appears homogeneous in density and abuts the uterus, possibly reflecting an exophytic fibroid. The uterus otherwise appears normal. No right adnexal mass or pelvic inflammatory changes. Other: No evidence of abdominal wall mass or hernia. No ascites. Musculoskeletal: No acute or significant osseous findings. Lumbar spine facet arthropathy. IMPRESSION: 1. Mild intrahepatic and extrahepatic biliary dilatation of undetermined etiology. No calcified stones are identified. Consider right upper quadrant abdominal ultrasound  or MRCP for further evaluation. 2. No evidence of pancreatic or ampullary lesion. No pancreatic ductal dilatation. 3. Indeterminate left adnexal lesion, potentially an exophytic fibroid. This is incompletely evaluated by this noncontrast study, and further evaluation with pelvic ultrasound recommended. 4.  Aortic Atherosclerosis (ICD10-I70.0). Electronically Signed   By: Richardean Sale M.D.   On: 09/01/2020 15:22   MR 3D Recon At Scanner  Result Date: 09/02/2020 CLINICAL DATA:  70 year old female with acute onset epigastric pain and burning. Gallstone noted on ultrasound. EXAM: MRI ABDOMEN WITHOUT AND WITH CONTRAST (INCLUDING MRCP) TECHNIQUE: Multiplanar multisequence MR imaging of the abdomen was performed both before and after the administration of intravenous contrast. Heavily T2-weighted images of the biliary and pancreatic ducts were obtained, and three-dimensional MRCP images were rendered by post processing. CONTRAST:  65mL GADAVIST GADOBUTROL 1 MMOL/ML IV SOLN COMPARISON:  CT 09/01/2020 ultrasound 22 FINDINGS: Lower chest:  Lung bases are clear. Hepatobiliary:  Nosignificant intrahepatic duct dilatation. The common hepatic duct and common bile ducts are mildly dilated with the common bile duct measuring 9 mm. There are multiple filling defects within the common bile duct over several cm leading up to the ampulla. Filling defects are well seen on coronal image 16/series 3. The gallbladder is contracted around multiple small stones similar to the stones in the common bile duct. Stones measure approximately 2-3 mm each. No focal hepatic lesion. Pancreas: Normal pancreatic parenchymal intensity. No ductal dilatation or inflammation. Spleen: Normal spleen. Adrenals/urinary tract: Adrenal glands normal. Benign-appearing cyst of the RIGHT kidney. Stomach/Bowel: Stomach and limited of the small bowel is unremarkable Vascular/Lymphatic: Abdominal aortic normal caliber. No retroperitoneal periportal lymphadenopathy. Musculoskeletal: No aggressive osseous lesion IMPRESSION: 1. Multiple stones within the common bile duct (choledocholithiasis). Minimal intrahepatic duct dilatation. 2. Gallbladder contracted around multiple gallstones. 3. No evidence of pancreatitis. Electronically Signed   By: Suzy Bouchard M.D.   On: 09/02/2020 07:45   MR ABDOMEN MRCP W WO CONTAST  Result Date: 09/02/2020 CLINICAL DATA:  70 year old female with acute onset epigastric pain and burning. Gallstone noted on ultrasound. EXAM: MRI ABDOMEN WITHOUT AND WITH CONTRAST (INCLUDING MRCP) TECHNIQUE: Multiplanar multisequence MR imaging of the abdomen was performed both before and after the administration of intravenous contrast. Heavily T2-weighted images of the biliary and pancreatic ducts were obtained, and three-dimensional MRCP images were rendered by post processing. CONTRAST:  81mL GADAVIST GADOBUTROL 1 MMOL/ML IV SOLN COMPARISON:  CT 09/01/2020 ultrasound 22 FINDINGS: Lower chest:  Lung bases are clear. Hepatobiliary: Nosignificant intrahepatic duct dilatation. The common hepatic duct and common bile  ducts are mildly dilated with the common bile duct measuring 9 mm. There are multiple filling defects within the common bile duct over several cm leading up to the ampulla. Filling defects are well seen on coronal image 16/series 3. The gallbladder is contracted around multiple small stones similar to the stones in the common bile duct. Stones measure approximately 2-3 mm each. No focal hepatic lesion. Pancreas: Normal pancreatic parenchymal intensity. No ductal dilatation or inflammation. Spleen: Normal spleen. Adrenals/urinary tract: Adrenal glands normal. Benign-appearing cyst of the RIGHT kidney. Stomach/Bowel: Stomach and limited of the small bowel is unremarkable Vascular/Lymphatic: Abdominal aortic normal caliber. No retroperitoneal periportal lymphadenopathy. Musculoskeletal: No aggressive osseous lesion IMPRESSION: 1. Multiple stones within the common bile duct (choledocholithiasis). Minimal intrahepatic duct dilatation. 2. Gallbladder contracted around multiple gallstones. 3. No evidence of pancreatitis. Electronically Signed   By: Suzy Bouchard M.D.   On: 09/02/2020 07:45   US Abdomen Limited RUQ (LIVER/GB)  Result  Date: 09/01/2020 CLINICAL DATA:  Elevated bilirubin EXAM: ULTRASOUND ABDOMEN LIMITED RIGHT UPPER QUADRANT COMPARISON:  None. FINDINGS: Gallbladder: Gallbladder is decompressed with multiple stones within. No wall thickening or pericholecystic fluid is noted. Negative sonographic Murphy's sign is elicited. Common bile duct: Diameter: 7 mm Liver: No focal lesion identified. Within normal limits in parenchymal echogenicity. Portal vein is patent on color Doppler imaging with normal direction of blood flow towards the liver. Other: None. IMPRESSION: Multiple gallstones in a decompressed gallbladder. No other focal abnormality is noted. Electronically Signed   By: Inez Catalina M.D.   On: 09/01/2020 16:41               LOS: 0 days   Paulo Keimig  [facility]   Pager  on [email or web address]. If 7PM-7AM, please contact night-coverage at [email or web address]     09/02/2020, 3:15 PM

## 2020-09-02 NOTE — Progress Notes (Signed)
Order received from Dr Mal Misty to renew telemetry

## 2020-09-02 NOTE — Consult Note (Signed)
[facility] SURGICAL CONSULTATION NOTE (initial) - cpt: 40086   HISTORY OF PRESENT ILLNESS (HPI):  70 y.o. female presented to [facility] ED yesterday for evaluation of abdominal pain. Patient reports around 24 hours prior to arrival she noticed a burning sensation in her right upper quadrant. She believes she has had intermittent episodes of similar, less severe, pain over the course of the last few weeks but didn't think much of it. She tried milk and sucralfate which did provide her significant relief. No fever, chills, cough, CP, SOB, urinary changes, or bowel changes. No previous intra-abdominal surgeries. Work up in the ED was concerning for hyperbilirubinemia to 4.5 (now 5.2), and CT Abdomen/pelvis was concerning for significant dilation of CBD  Surgery is consulted by hospitalist physician Dr. Jennye Boroughs, MD in this context for evaluation and management of choledocholithiasis.  PAST MEDICAL HISTORY (PMH):  Past Medical History:  Diagnosis Date  . Breast cancer ([place]) 2006   LT MASTECTOMY  . Diabetes mellitus without complication (HCC)    borderline  . GERD (gastroesophageal reflux disease)   . Hyperlipidemia   . S/P chemotherapy, time since greater than 12 weeks 2006   BREAST CA  . Vitamin D deficiency      PAST SURGICAL HISTORY ([place]):  Past Surgical History:  Procedure Laterality Date  . BREAST EXCISIONAL BIOPSY Right 2000?   EXCISIONAL - NEG  . COLONOSCOPY WITH PROPOFOL N/A 04/13/2015   Procedure: COLONOSCOPY WITH PROPOFOL;  Surgeon: Hulen Luster, MD;  Location: [facility] ENDOSCOPY;  Service: Gastroenterology;  Laterality: N/A;  . MASTECTOMY Left 2006   breast ca     MEDICATIONS:  Prior to Admission medications   Medication Sig Start Date End Date Taking? Authorizing Provider  Calcium Carb-Cholecalciferol 600-400 MG-UNIT TABS Take 1 tablet by mouth in the morning and at bedtime.   Yes [provider]  famotidine (PEPCID) 40 MG tablet Take 40 mg by mouth 2  (two) times daily.   Yes [provider]  fluticasone (FLONASE) 50 MCG/ACT nasal spray Place 2 sprays into both nostrils daily.   Yes [provider]  sucralfate (CARAFATE) 1 g tablet Take 1 g by mouth 4 (four) times daily. 07/17/20  Yes [provider]  alendronate (FOSAMAX) 10 MG tablet Take 10 mg by mouth every 7 (seven) days. Take with a full glass of water on an empty stomach. Patient not taking: Reported on 09/01/2020    [provider]  ergocalciferol (VITAMIN D2) 50000 UNITS capsule Take 50,000 Units by mouth once a week. Patient not taking: Reported on 09/01/2020    [provider]  ranitidine (ZANTAC) 150 MG tablet Take 150 mg by mouth daily. Patient not taking: Reported on 09/01/2020    [provider]     ALLERGIES:  Allergies  Allergen Reactions  . Aspirin Other (See Comments)    Gi upset  . Other Other (See Comments)    GI upset     SOCIAL HISTORY:  Social History   Socioeconomic History  . Marital status: Divorced    Spouse name: Not on file  . Number of children: Not on file  . Years of education: Not on file  . Highest education level: Not on file  Occupational History  . Not on file  Tobacco Use  . Smoking status: Former Smoker    Packs/day: 1.00    Years: 10.00    Pack years: 10.00    Types: Cigarettes  . Smokeless tobacco: Never Used  Substance and  Sexual Activity  . Alcohol use: Never  . Drug use: No  . Sexual activity: Not on file  Other Topics Concern  . Not on file  Social History Narrative  . Not on file   Social Determinants of Health   Financial Resource Strain: Not on file  Food Insecurity: Not on file  Transportation Needs: Not on file  Physical Activity: Not on file  Stress: Not on file  Social Connections: Not on file  Intimate Partner Violence: Not on file     FAMILY HISTORY:  Family History  Problem Relation Age of Onset  . Breast cancer Sister 46      REVIEW OF SYSTEMS:   Review of Systems  Constitutional: Negative for chills and fever.  HENT: Negative for congestion and sore throat.   Respiratory: Negative for cough and shortness of breath.   Cardiovascular: Negative for chest pain and palpitations.  Gastrointestinal: Positive for abdominal pain (Now resolved). Negative for nausea and vomiting.  Genitourinary: Negative for dysuria and urgency.  All other systems reviewed and are negative.   VITAL SIGNS:  Temp:  [97.9 F (36.6 C)-99.1 F (37.3 C)] 97.9 F (36.6 C) (05/11 1059) Pulse Rate:  [65-99] 65 (05/11 1059) Resp:  [15-18] 18 (05/11 1059) BP: (100-135)/(55-115) 119/62 (05/11 1059) SpO2:  [97 %-100 %] 99 % (05/11 1059)     Height: 5\' 6"  (167.6 cm) Weight: 68.9 kg BMI (Calculated): 24.55   INTAKE/OUTPUT:  No intake/output data recorded.  PHYSICAL EXAM:  Physical Exam Vitals and nursing note reviewed. Exam conducted with a chaperone present.  Constitutional:      General: She is not in acute distress.    Appearance: She is well-developed and normal weight. She is not ill-appearing.  HENT:     Head: Normocephalic.  Eyes:     General: Scleral icterus present.     Extraocular Movements: Extraocular movements intact.  Cardiovascular:     Rate and Rhythm: Normal rate.     Heart sounds: Normal heart sounds.  Pulmonary:     Effort: Pulmonary effort is normal. No respiratory distress.  Abdominal:     General: There is no distension.     Palpations: Abdomen is soft.     Tenderness: There is no abdominal tenderness. There is no guarding or rebound. Negative signs include Murphy's sign.  Genitourinary:    Comments: Deferred Skin:    General: Skin is warm and dry.     Coloration: Skin is not jaundiced or pale.  Neurological:     General: No focal deficit present.     Mental Status: She is alert and oriented to person, place, and time.  Psychiatric:        Mood and Affect: Mood normal.        Behavior: Behavior normal.      Labs:  CBC  Latest Ref Rng & Units 09/02/2020 09/01/2020 04/08/2014  WBC 4.0 - 10.5 K/uL 5.8 9.6 8.2  Hemoglobin 12.0 - 15.0 g/dL 12.3 14.8 12.7  Hematocrit 36.0 - 46.0 % 37.4 44.5 39.8  Platelets 150 - 400 K/uL 308 281 321   CMP Latest Ref Rng & Units 09/02/2020 09/01/2020 04/08/2014  Glucose 70 - 99 mg/dL 101(H) 117(H) 156(H)  BUN 8 - 23 mg/dL 14 14 6(L)  Creatinine 0.44 - 1.00 mg/dL 0.92 0.82 1.01  Sodium 135 - 145 mmol/L 133(L) 136 137  Potassium 3.5 - 5.1 mmol/L 3.2(L) 3.8 3.3(L)  Chloride 98 - 111 mmol/L 101 99 100  CO2 22 -  32 mmol/L 24 26 26   Calcium 8.9 - 10.3 mg/dL 8.9 9.7 8.9  Total Protein 6.5 - 8.1 g/dL 7.1 8.0 7.7  Total Bilirubin 0.3 - 1.2 mg/dL 5.2(H) 4.5(H) 0.6  Alkaline Phos 38 - 126 U/L 160(H) 150(H) 52  AST 15 - 41 U/L 343(H) 847(H) 13(L)  ALT 0 - 44 U/L 554(H) 717(H) 35     Imaging studies:   CT Abdomen/Pelvis (09/01/2020) personally reviewed showing significant CBD dilation on coronal views and radiologist report reviewed:  IMPRESSION: 1. Mild intrahepatic and extrahepatic biliary dilatation of undetermined etiology. No calcified stones are identified. Consider right upper quadrant abdominal ultrasound or MRCP for further evaluation. 2. No evidence of pancreatic or ampullary lesion. No pancreatic ductal dilatation. 3. Indeterminate left adnexal lesion, potentially an exophytic fibroid. This is incompletely evaluated by this noncontrast study, and further evaluation with pelvic ultrasound recommended. 4.  Aortic Atherosclerosis (ICD10-I70.0).  RUS [place] (09/01/2020) personally reviewed showing significant cholelithiasis, and radiologist report reviewed:  IMPRESSION: Multiple gallstones in a decompressed gallbladder. No other focal abnormality is noted.  MRCP (09/02/2020) personally reviewed showing choledocholithiasis, and radiologist report reviewed:  IMPRESSION: 1. Multiple stones within the common bile duct (choledocholithiasis). Minimal intrahepatic duct  dilatation. 2. Gallbladder contracted around multiple gallstones. 3. No evidence of pancreatitis.    Assessment/Plan: (ICD-10's: K80.50) 70 y.o. female with hyperbilirubinemia found to have choledocholithiasis with multiple stone without cholecystitis nor pancreatitis.   - Appreciate GI assistance; plan for ERCP tomorrow (05/12)  - Following ERCP, she will benefit from cholecystectomy to prevent recurrence. Tentatively anticipate Friday (05/13) pending clinical condition as well as Dr Rodenberg/OR/Ansthesia availability    - agree with CLD; NPO at midnight for ERCP  - Monitor hyperbilirubinemia  - Monitor abdominal examination  - Pain control prn; antiemetics prn    - Mobilize as tolerated   - Further management per primary service; we will follow   All of the above findings and recommendations were discussed with the patient and her daughter, and all of patient's and her daughter's questions were answered to her expressed satisfaction.  Thank you for the opportunity to participate in this patient's care.   -- Edison Simon, PA-C [facility] 09/02/2020, 12:48 PM [phone] M-F: 7am - 4pm

## 2020-09-03 ENCOUNTER — Observation Stay: Payer: Medicare HMO | Admitting: Anesthesiology

## 2020-09-03 ENCOUNTER — Encounter: Payer: Self-pay | Admitting: Internal Medicine

## 2020-09-03 ENCOUNTER — Encounter
Admission: EM | Disposition: A | Discharge status: Home / Self Care | Payer: Self-pay | Source: Home / Self Care | Attending: Internal Medicine

## 2020-09-03 ENCOUNTER — Observation Stay: Payer: Medicare HMO

## 2020-09-03 DIAGNOSIS — E782 Mixed hyperlipidemia: Secondary | ICD-10-CM | POA: Diagnosis present

## 2020-09-03 DIAGNOSIS — K8070 Calculus of gallbladder and bile duct without cholecystitis without obstruction: Secondary | ICD-10-CM | POA: Diagnosis not present

## 2020-09-03 DIAGNOSIS — Z9221 Personal history of antineoplastic chemotherapy: Secondary | ICD-10-CM | POA: Diagnosis not present

## 2020-09-03 DIAGNOSIS — Z853 Personal history of malignant neoplasm of breast: Secondary | ICD-10-CM | POA: Diagnosis not present

## 2020-09-03 DIAGNOSIS — I440 Atrioventricular block, first degree: Secondary | ICD-10-CM | POA: Diagnosis present

## 2020-09-03 DIAGNOSIS — E876 Hypokalemia: Secondary | ICD-10-CM | POA: Diagnosis present

## 2020-09-03 DIAGNOSIS — K8010 Calculus of gallbladder with chronic cholecystitis without obstruction: Secondary | ICD-10-CM | POA: Diagnosis not present

## 2020-09-03 DIAGNOSIS — Z7983 Long term (current) use of bisphosphonates: Secondary | ICD-10-CM | POA: Diagnosis not present

## 2020-09-03 DIAGNOSIS — K8050 Calculus of bile duct without cholangitis or cholecystitis without obstruction: Secondary | ICD-10-CM | POA: Diagnosis present

## 2020-09-03 DIAGNOSIS — K8065 Calculus of gallbladder and bile duct with chronic cholecystitis with obstruction: Secondary | ICD-10-CM | POA: Diagnosis present

## 2020-09-03 DIAGNOSIS — K219 Gastro-esophageal reflux disease without esophagitis: Secondary | ICD-10-CM | POA: Diagnosis present

## 2020-09-03 DIAGNOSIS — Z9012 Acquired absence of left breast and nipple: Secondary | ICD-10-CM | POA: Diagnosis not present

## 2020-09-03 DIAGNOSIS — Z886 Allergy status to analgesic agent status: Secondary | ICD-10-CM | POA: Diagnosis not present

## 2020-09-03 DIAGNOSIS — E119 Type 2 diabetes mellitus without complications: Secondary | ICD-10-CM | POA: Diagnosis present

## 2020-09-03 DIAGNOSIS — Z87891 Personal history of nicotine dependence: Secondary | ICD-10-CM | POA: Diagnosis not present

## 2020-09-03 DIAGNOSIS — Z20822 Contact with and (suspected) exposure to covid-19: Secondary | ICD-10-CM | POA: Diagnosis present

## 2020-09-03 DIAGNOSIS — Z803 Family history of malignant neoplasm of breast: Secondary | ICD-10-CM | POA: Diagnosis not present

## 2020-09-03 DIAGNOSIS — R748 Abnormal levels of other serum enzymes: Secondary | ICD-10-CM | POA: Diagnosis not present

## 2020-09-03 DIAGNOSIS — Z79899 Other long term (current) drug therapy: Secondary | ICD-10-CM | POA: Diagnosis not present

## 2020-09-03 HISTORY — PX: ERCP: SHX5425

## 2020-09-03 LAB — COMPREHENSIVE METABOLIC PANEL
ALT: 365 U/L — ABNORMAL HIGH (ref 0–44)
AST: 142 U/L — ABNORMAL HIGH (ref 15–41)
Albumin: 3.8 g/dL (ref 3.5–5.0)
Alkaline Phosphatase: 140 U/L — ABNORMAL HIGH (ref 38–126)
Anion gap: 10 (ref 5–15)
BUN: 12 mg/dL (ref 8–23)
CO2: 24 mmol/L (ref 22–32)
Calcium: 8.9 mg/dL (ref 8.9–10.3)
Chloride: 104 mmol/L (ref 98–111)
Creatinine, Ser: 0.93 mg/dL (ref 0.44–1.00)
GFR, Estimated: >60 mL/min (ref >60)
Glucose, Bld: 88 mg/dL (ref 70–99)
Potassium: 3.6 mmol/L (ref 3.5–5.1)
Sodium: 138 mmol/L (ref 135–145)
Total Bilirubin: 3 mg/dL — ABNORMAL HIGH (ref 0.3–1.2)
Total Protein: 7.1 g/dL (ref 6.5–8.1)

## 2020-09-03 SURGERY — ERCP, WITH INTERVENTION IF INDICATED
Anesthesia: General

## 2020-09-03 MED ORDER — SODIUM CHLORIDE 0.9 % IV SOLN: INTRAVENOUS | Status: DC

## 2020-09-03 MED ORDER — PROPOFOL 500 MG/50ML IV EMUL
INTRAVENOUS | Status: DC | PRN
  Administered 2020-09-03: 120 ug/kg/min via INTRAVENOUS

## 2020-09-03 MED ORDER — PROPOFOL 500 MG/50ML IV EMUL
INTRAVENOUS | Status: AC
  Filled 2020-09-03: qty 50

## 2020-09-03 MED ORDER — LIDOCAINE HCL (PF) 2 % IJ SOLN
INTRAMUSCULAR | Status: AC
  Filled 2020-09-03: qty 2

## 2020-09-03 MED ORDER — INDOCYANINE GREEN 25 MG IV SOLR
2.5000 mg | Freq: Once | INTRAVENOUS | Status: AC
  Administered 2020-09-04: 2.5 mg via INTRAVENOUS
  Filled 2020-09-03: qty 1

## 2020-09-03 MED ORDER — INDOMETHACIN 50 MG RE SUPP
RECTAL | Status: AC
  Administered 2020-09-03: 100 mg via RECTAL
  Filled 2020-09-03: qty 2

## 2020-09-03 MED ORDER — INDOMETHACIN 50 MG RE SUPP: 50.0000 mg | Freq: Once | RECTAL | Status: AC

## 2020-09-03 NOTE — Progress Notes (Addendum)
Progress Note    Candice Leblanc  UEA:[id number] DOB: 1950-07-07  DOA: 09/01/2020 PCP: Dion Body, MD      Brief Narrative:    Medical records reviewed and are as summarized below:  Candice Leblanc is a 70 y.o. female with past medical history significant for hyperlipidemia, GERD, borderline diabetes mellitus, remote history of breast cancer status post left mastectomy and chemotherapy in 2006.  She presented to the hospital because of nausea and abdominal pain.  Work-up revealed choledocholithiasis, cholelithiasis and elevated liver enzymes.    Assessment/Plan:   Principal Problem:   Choledocholithiasis Active Problems:   Elevated liver enzymes   Hyperlipidemia, mixed    Body mass index is 24.53 kg/m.   Abdominal pain, choledocholithiasis, cholelithiasis, elevated liver enzymes: No evidence of pancreatitis, cholecystitis or cholangitis.  Plan for ERCP today followed by cholecystectomy tomorrow.  Elevated liver enzymes are coming down.  Check lipase level tomorrow  Hypokalemia: Improved  First-degree AV block on telemetry.  Stable.  Discontinue telemetry  Other comorbidities include hyperlipidemia, GERD, borderline diabetes mellitus, remote history of breast cancer status post left mastectomy in 2006   Diet Order            Diet NPO time specified Except for: Ice Chips, Sips with Meds  Diet effective 0500 tomorrow                    Consultants:  Engineer, water  Procedures:  None    Medications:   . [MAR Hold] famotidine  40 mg Oral BID  . [MAR Hold] fluticasone  2 spray Each Nare Daily  . indomethacin  50 mg Rectal Once  . indomethacin       Continuous Infusions: . sodium chloride       Anti-infectives (From admission, onward)   None             Family Communication/Anticipated D/C date and plan/Code Status   DVT prophylaxis: Place TED hose Start: 09/01/20 1712     Code  Status: Full Code  Family Communication: None Disposition Plan:    Status is: Observation  The patient will require care spanning > 2 midnights and should be moved to inpatient because: Ongoing diagnostic testing needed not appropriate for outpatient work up and Inpatient level of care appropriate due to severity of illness  Dispo: The patient is from: Home              Anticipated d/c is to: Home              Patient currently is not medically stable to d/c.   Difficult to place patient No           Subjective:   Interval events noted.  No abdominal pain, nausea or vomiting.  Objective:    Vitals:   09/03/20 0000 09/03/20 0340 09/03/20 0922 09/03/20 1052  BP: (!) 108/50 (!) 104/50 136/72 (!) 165/77  Pulse: 67 64 95 87  Resp: 16 18 12 16   Temp: 97.8 F (36.6 C) 98 F (36.7 C) 98.5 F (36.9 C) (!) 96.8 F (36 C)  TempSrc: Oral Oral Oral Temporal  SpO2: 98% 98% 97% 99%  Weight:      Height:    5\' 6"  (1.676 m)   No data found.   Intake/Output Summary (Last 24 hours) at 09/03/2020 1127 Last data filed at 09/02/2020 2300 Gross per 24 hour  Intake 1378 ml  Output --  Net 1378 ml  Filed Weights   09/01/20 1107  Weight: 68.9 kg    Exam:  GEN: NAD SKIN: No rash EYES: EOMI ENT: MMM CV: RRR PULM: CTA B ABD: soft, ND, NT, +BS CNS: AAO x 3, non focal EXT: No edema or tenderness        Data Reviewed:   I have personally reviewed following labs and imaging studies:  Labs: Labs show the following:   Basic Metabolic Panel: Recent Labs  Lab 09/01/20 1214 09/02/20 0315 09/03/20 0308  NA 136 133* 138  K 3.8 3.2* 3.6  CL 99 101 104  CO2 26 24 24   GLUCOSE 117* 101* 88  BUN 14 14 12   CREATININE 0.82 0.92 0.93  CALCIUM 9.7 8.9 8.9  MG  --  2.1  --   PHOS  --  4.3  --    GFR Estimated Creatinine Clearance: 52.7 mL/min (by C-G formula based on SCr of 0.93 mg/dL). Liver Function Tests: Recent Labs  Lab 09/01/20 1214 09/02/20 0315  09/03/20 0308  AST 847* 343* 142*  ALT 717* 554* 365*  ALKPHOS 150* 160* 140*  BILITOT 4.5* 5.2* 3.0*  PROT 8.0 7.1 7.1  ALBUMIN 4.4 3.8 3.8   Recent Labs  Lab 09/01/20 1214  LIPASE 44   No results for input(s): AMMONIA in the last 168 hours. Coagulation profile No results for input(s): INR, PROTIME in the last 168 hours.  CBC: Recent Labs  Lab 09/01/20 1214 09/02/20 0315  WBC 9.6 5.8  NEUTROABS 8.7*  --   HGB 14.8 12.3  HCT 44.5 37.4  MCV 88.1 86.8  PLT 281 308   Cardiac Enzymes: No results for input(s): CKTOTAL, CKMB, CKMBINDEX, TROPONINI in the last 168 hours. BNP (last 3 results) No results for input(s): PROBNP in the last 8760 hours. CBG: No results for input(s): GLUCAP in the last 168 hours. D-Dimer: No results for input(s): DDIMER in the last 72 hours. Hgb A1c: No results for input(s): HGBA1C in the last 72 hours. Lipid Profile: No results for input(s): CHOL, HDL, LDLCALC, TRIG, CHOLHDL, LDLDIRECT in the last 72 hours. Thyroid function studies: No results for input(s): TSH, T4TOTAL, T3FREE, THYROIDAB in the last 72 hours.  Invalid input(s): FREET3 Anemia work up: No results for input(s): VITAMINB12, FOLATE, FERRITIN, TIBC, IRON, RETICCTPCT in the last 72 hours. Sepsis Labs: Recent Labs  Lab 09/01/20 1214 09/02/20 0315  WBC 9.6 5.8    Microbiology Recent Results (from the past 240 hour(s))  Resp Panel by RT-PCR (Flu A&B, Covid) Nasopharyngeal Swab     Status: None   Collection Time: 09/01/20  4:53 PM   Specimen: Nasopharyngeal Swab; Nasopharyngeal(NP) swabs in vial transport medium  Result Value Ref Range Status   SARS Coronavirus 2 by RT PCR NEGATIVE NEGATIVE Final    Comment: (NOTE) SARS-CoV-2 target nucleic acids are NOT DETECTED.  The SARS-CoV-2 RNA is generally detectable in upper respiratory specimens during the acute phase of infection. The lowest concentration of SARS-CoV-2 viral copies this assay can detect is 138 copies/mL. A  negative result does not preclude SARS-Cov-2 infection and should not be used as the sole basis for treatment or other patient management decisions. A negative result may occur with  improper specimen collection/handling, submission of specimen other than nasopharyngeal swab, presence of viral mutation(s) within the areas targeted by this assay, and inadequate number of viral copies(<138 copies/mL). A negative result must be combined with clinical observations, patient history, and epidemiological information. The expected result is Negative.  Fact Sheet for  Patients:  EntrepreneurPulse.com.au  Fact Sheet for Healthcare Providers:  IncredibleEmployment.be  This test is no t yet approved or cleared by the [place] FDA and  has been authorized for detection and/or diagnosis of SARS-CoV-2 by FDA under an Emergency Use Authorization (EUA). This EUA will remain  in effect (meaning this test can be used) for the duration of the COVID-19 declaration under Section 564(b)(1) of the Act, 21 U.S.C.section 360bbb-3(b)(1), unless the authorization is terminated  or revoked sooner.       Influenza A by PCR NEGATIVE NEGATIVE Final   Influenza B by PCR NEGATIVE NEGATIVE Final    Comment: (NOTE) The Xpert Xpress SARS-CoV-2/FLU/RSV plus assay is intended as an aid in the diagnosis of influenza from Nasopharyngeal swab specimens and should not be used as a sole basis for treatment. Nasal washings and aspirates are unacceptable for Xpert Xpress SARS-CoV-2/FLU/RSV testing.  Fact Sheet for Patients: EntrepreneurPulse.com.au  Fact Sheet for Healthcare Providers: IncredibleEmployment.be  This test is not yet approved or cleared by the [place] FDA and has been authorized for detection and/or diagnosis of SARS-CoV-2 by FDA under an Emergency Use Authorization (EUA). This EUA will remain in effect (meaning this test can  be used) for the duration of the COVID-19 declaration under Section 564(b)(1) of the Act, 21 U.S.C. section 360bbb-3(b)(1), unless the authorization is terminated or revoked.  Performed at [facility], [address] [place], [place] [address]     Procedures and diagnostic studies:  CT ABDOMEN PELVIS WO CONTRAST  Result Date: 09/01/2020 CLINICAL DATA:  Epigastric pain since last night. Patient denies nausea, vomiting and diarrhea. Elevated liver function studies. Biliary obstruction suspected. EXAM: CT ABDOMEN AND PELVIS WITHOUT CONTRAST TECHNIQUE: Multidetector CT imaging of the abdomen and pelvis was performed following the standard protocol without IV contrast. COMPARISON:  None. FINDINGS: Lower chest: Mild linear atelectasis or scarring in the right lower lobe. The lung bases are otherwise clear. Trace pericardial fluid with mild coronary and aortic atherosclerosis. Hepatobiliary: No focal hepatic abnormalities are identified on noncontrast imaging. The gallbladder is incompletely distended. No evidence of gallstones, gallbladder wall thickening or pericholecystic fluid. There is mild intra and extrahepatic biliary dilatation with the common hepatic duct measuring 9 mm in diameter. No calcified intraductal calculi are visualized. Pancreas: No evidence of pancreatic head or ampullary mass. There is no pancreatic ductal dilatation, surrounding inflammation or fluid collection. Spleen: Normal in size without focal abnormality. Adrenals/Urinary Tract: Both adrenal glands appear normal. There are 2 prominent water density cysts in the interpolar region of the left kidney measuring up to 4.1 cm in diameter. Posteriorly in the mid left kidney, there is a subcentimeter hyperdense lesion which is likely a small hemorrhagic cysts. No evidence of urinary tract calculus or hydronephrosis. The bladder appears normal. Stomach/Bowel: No enteric contrast administered. The stomach appears unremarkable  for its degree of distension. No evidence of bowel wall thickening, distention or surrounding inflammatory change. The appendix appears normal. Vascular/Lymphatic: There are no enlarged abdominal or pelvic lymph nodes. Aortic and branch vessel atherosclerosis without acute vascular findings on noncontrast imaging. Reproductive: There is a 5.6 x 4.5 cm left adnexal mass on image 60/2. This appears homogeneous in density and abuts the uterus, possibly reflecting an exophytic fibroid. The uterus otherwise appears normal. No right adnexal mass or pelvic inflammatory changes. Other: No evidence of abdominal wall mass or hernia. No ascites. Musculoskeletal: No acute or significant osseous findings. Lumbar spine facet arthropathy. IMPRESSION: 1. Mild intrahepatic and extrahepatic biliary  dilatation of undetermined etiology. No calcified stones are identified. Consider right upper quadrant abdominal ultrasound or MRCP for further evaluation. 2. No evidence of pancreatic or ampullary lesion. No pancreatic ductal dilatation. 3. Indeterminate left adnexal lesion, potentially an exophytic fibroid. This is incompletely evaluated by this noncontrast study, and further evaluation with pelvic ultrasound recommended. 4.  Aortic Atherosclerosis (ICD10-I70.0). Electronically Signed   By: Richardean Sale M.D.   On: 09/01/2020 15:22   MR 3D Recon At Scanner  Result Date: 09/02/2020 CLINICAL DATA:  70 year old female with acute onset epigastric pain and burning. Gallstone noted on ultrasound. EXAM: MRI ABDOMEN WITHOUT AND WITH CONTRAST (INCLUDING MRCP) TECHNIQUE: Multiplanar multisequence MR imaging of the abdomen was performed both before and after the administration of intravenous contrast. Heavily T2-weighted images of the biliary and pancreatic ducts were obtained, and three-dimensional MRCP images were rendered by post processing. CONTRAST:  68mL GADAVIST GADOBUTROL 1 MMOL/ML IV SOLN COMPARISON:  CT 09/01/2020 ultrasound 22  FINDINGS: Lower chest:  Lung bases are clear. Hepatobiliary: Nosignificant intrahepatic duct dilatation. The common hepatic duct and common bile ducts are mildly dilated with the common bile duct measuring 9 mm. There are multiple filling defects within the common bile duct over several cm leading up to the ampulla. Filling defects are well seen on coronal image 16/series 3. The gallbladder is contracted around multiple small stones similar to the stones in the common bile duct. Stones measure approximately 2-3 mm each. No focal hepatic lesion. Pancreas: Normal pancreatic parenchymal intensity. No ductal dilatation or inflammation. Spleen: Normal spleen. Adrenals/urinary tract: Adrenal glands normal. Benign-appearing cyst of the RIGHT kidney. Stomach/Bowel: Stomach and limited of the small bowel is unremarkable Vascular/Lymphatic: Abdominal aortic normal caliber. No retroperitoneal periportal lymphadenopathy. Musculoskeletal: No aggressive osseous lesion IMPRESSION: 1. Multiple stones within the common bile duct (choledocholithiasis). Minimal intrahepatic duct dilatation. 2. Gallbladder contracted around multiple gallstones. 3. No evidence of pancreatitis. Electronically Signed   By: Suzy Bouchard M.D.   On: 09/02/2020 07:45   MR ABDOMEN MRCP W WO CONTAST  Result Date: 09/02/2020 CLINICAL DATA:  70 year old female with acute onset epigastric pain and burning. Gallstone noted on ultrasound. EXAM: MRI ABDOMEN WITHOUT AND WITH CONTRAST (INCLUDING MRCP) TECHNIQUE: Multiplanar multisequence MR imaging of the abdomen was performed both before and after the administration of intravenous contrast. Heavily T2-weighted images of the biliary and pancreatic ducts were obtained, and three-dimensional MRCP images were rendered by post processing. CONTRAST:  6mL GADAVIST GADOBUTROL 1 MMOL/ML IV SOLN COMPARISON:  CT 09/01/2020 ultrasound 22 FINDINGS: Lower chest:  Lung bases are clear. Hepatobiliary: Nosignificant  intrahepatic duct dilatation. The common hepatic duct and common bile ducts are mildly dilated with the common bile duct measuring 9 mm. There are multiple filling defects within the common bile duct over several cm leading up to the ampulla. Filling defects are well seen on coronal image 16/series 3. The gallbladder is contracted around multiple small stones similar to the stones in the common bile duct. Stones measure approximately 2-3 mm each. No focal hepatic lesion. Pancreas: Normal pancreatic parenchymal intensity. No ductal dilatation or inflammation. Spleen: Normal spleen. Adrenals/urinary tract: Adrenal glands normal. Benign-appearing cyst of the RIGHT kidney. Stomach/Bowel: Stomach and limited of the small bowel is unremarkable Vascular/Lymphatic: Abdominal aortic normal caliber. No retroperitoneal periportal lymphadenopathy. Musculoskeletal: No aggressive osseous lesion IMPRESSION: 1. Multiple stones within the common bile duct (choledocholithiasis). Minimal intrahepatic duct dilatation. 2. Gallbladder contracted around multiple gallstones. 3. No evidence of pancreatitis. Electronically Signed   By: Suzy Bouchard  M.D.   On: 09/02/2020 07:45   US Abdomen Limited RUQ (LIVER/GB)  Result Date: 09/01/2020 CLINICAL DATA:  Elevated bilirubin EXAM: ULTRASOUND ABDOMEN LIMITED RIGHT UPPER QUADRANT COMPARISON:  None. FINDINGS: Gallbladder: Gallbladder is decompressed with multiple stones within. No wall thickening or pericholecystic fluid is noted. Negative sonographic Murphy's sign is elicited. Common bile duct: Diameter: 7 mm Liver: No focal lesion identified. Within normal limits in parenchymal echogenicity. Portal vein is patent on color Doppler imaging with normal direction of blood flow towards the liver. Other: None. IMPRESSION: Multiple gallstones in a decompressed gallbladder. No other focal abnormality is noted. Electronically Signed   By: Inez Catalina M.D.   On: 09/01/2020 16:41                LOS: 0 days   Giamarie Bueche  [facility]   Pager on [email or web address]. If 7PM-7AM, please contact night-coverage at [email or web address]     09/03/2020, 11:27 AM

## 2020-09-03 NOTE — Anesthesia Preprocedure Evaluation (Signed)
Anesthesia Evaluation  Patient identified by MRN, date of birth, ID band Patient awake    Reviewed: Allergy & Precautions, H&P , NPO status , Patient's Chart, lab work & pertinent test results, reviewed documented beta blocker date and time   Airway Mallampati: II   Neck ROM: full    Dental  (+) Poor Dentition   Pulmonary neg pulmonary ROS, former smoker,    Pulmonary exam normal        Cardiovascular Exercise Tolerance: Good negative cardio ROS Normal cardiovascular exam Rhythm:regular Rate:Normal     Neuro/Psych negative neurological ROS  negative psych ROS   GI/Hepatic Neg liver ROS, GERD  Medicated,  Endo/Other  negative endocrine ROSdiabetes, Type 2  Renal/GU negative Renal ROS  negative genitourinary   Musculoskeletal   Abdominal   Peds  Hematology negative hematology ROS (+)   Anesthesia Other Findings Past Medical History: 2006: Breast cancer ([place])     Comment:  LT MASTECTOMY No date: Diabetes mellitus without complication (HCC)     Comment:  borderline No date: GERD (gastroesophageal reflux disease) No date: Hyperlipidemia 2006: S/P chemotherapy, time since greater than 12 weeks     Comment:  BREAST CA No date: Vitamin D deficiency Past Surgical History: 2000?: BREAST EXCISIONAL BIOPSY; Right     Comment:  EXCISIONAL - NEG 04/13/2015: COLONOSCOPY WITH PROPOFOL; N/A     Comment:  Procedure: COLONOSCOPY WITH PROPOFOL;  Surgeon: Hulen Luster, MD;  Location: [facility] ENDOSCOPY;  Service:               Gastroenterology;  Laterality: N/A; 2006: MASTECTOMY; Left     Comment:  breast ca BMI    Body Mass Index: 24.53 kg/m     Reproductive/Obstetrics negative OB ROS                             Anesthesia Physical Anesthesia Plan  ASA: III  Anesthesia Plan: General   Post-op Pain Management:    Induction:   PONV Risk Score and Plan:   Airway Management  Planned:   Additional Equipment:   Intra-op Plan:   Post-operative Plan:   Informed Consent: I have reviewed the patients History and Physical, chart, labs and discussed the procedure including the risks, benefits and alternatives for the proposed anesthesia with the patient or authorized representative who has indicated his/her understanding and acceptance.     Dental Advisory Given  Plan Discussed with: CRNA  Anesthesia Plan Comments:         Anesthesia Quick Evaluation

## 2020-09-03 NOTE — Transfer of Care (Signed)
Immediate Anesthesia Transfer of Care Note  Patient: Candice Leblanc  Procedure(s) Performed: ENDOSCOPIC RETROGRADE CHOLANGIOPANCREATOGRAPHY (ERCP) (N/A )  Patient Location: PACU  Anesthesia Type:General  Level of Consciousness: awake and sedated  Airway & Oxygen Therapy: Patient Spontanous Breathing and Patient connected to nasal cannula oxygen  Post-op Assessment: Report given to RN and Post -op Vital signs reviewed and stable  Post vital signs: Reviewed and stable  Last Vitals:  Vitals Value Taken Time  BP    Temp    Pulse    Resp    SpO2      Last Pain:  Vitals:   09/03/20 1052  TempSrc: Temporal  PainSc: 0-No pain         Complications: No complications documented.

## 2020-09-03 NOTE — Progress Notes (Signed)
[facility] SURGICAL PROGRESS NOTE (cpt [phone])  Hospital Day(s): 0.   Post op day(s): Day of Surgery.   Interval History: Patient seen and examined, recently returned from her ERCP.  No acute events or new complaints developed overnight. Patient reports being essentially pain-free, denies nausea or vomiting.  Patient's daughter at bedside.  Review of Systems:  Constitutional: denies fever, chills  HEENT: denies cough or congestion  Respiratory: denies any shortness of breath  Cardiovascular: denies chest pain or palpitations  Gastrointestinal: denies abdominal pain, N/V, or diarrhea/and bowel function as per interval history Genitourinary: denies burning with urination or urinary frequency Musculoskeletal: denies pain, decreased motor or sensation Integumentary: denies any other rashes or skin Neurological: denies HA or vision/hearing changes   Vital signs in last 24 hours: [min-max] current  Temp:  [96.5 F (35.8 C)-98.5 F (36.9 C)] 98 F (36.7 C) (05/12 1255) Pulse Rate:  [64-95] 72 (05/12 1255) Resp:  [12-20] 20 (05/12 1255) BP: (104-165)/(50-78) 125/66 (05/12 1255) SpO2:  [97 %-100 %] 100 % (05/12 1255)     Height: 5\' 6"  (167.6 cm) Weight: 68.9 kg BMI (Calculated): 24.55   Intake/Output last 2 shifts:  05/11 0701 - 05/12 0700 In: 2158 [P.O.:2158] Out: -    Physical Exam:  Constitutional: alert, cooperative and no distress  HENT: normocephalic without obvious abnormality  Neuro: CN II - XII grossly intact and symmetric without deficit  Respiratory: breathing non-labored at rest  Cardiovascular: regular rate and sinus rhythm  Gastrointestinal: soft, non-tender, and non-distended Musculoskeletal: UE and LE FROM, no edema or wounds, motor and sensation grossly intact, NT    Labs:  CBC Latest Ref Rng & Units 09/02/2020 09/01/2020 04/08/2014  WBC 4.0 - 10.5 K/uL 5.8 9.6 8.2  Hemoglobin 12.0 - 15.0 g/dL 12.3 14.8 12.7  Hematocrit 36.0 - 46.0 % 37.4 44.5  39.8  Platelets 150 - 400 K/uL 308 281 321   CMP Latest Ref Rng & Units 09/03/2020 09/02/2020 09/01/2020  Glucose 70 - 99 mg/dL 88 101(H) 117(H)  BUN 8 - 23 mg/dL 12 14 14   Creatinine 0.44 - 1.00 mg/dL 0.93 0.92 0.82  Sodium 135 - 145 mmol/L 138 133(L) 136  Potassium 3.5 - 5.1 mmol/L 3.6 3.2(L) 3.8  Chloride 98 - 111 mmol/L 104 101 99  CO2 22 - 32 mmol/L 24 24 26   Calcium 8.9 - 10.3 mg/dL 8.9 8.9 9.7  Total Protein 6.5 - 8.1 g/dL 7.1 7.1 8.0  Total Bilirubin 0.3 - 1.2 mg/dL 3.0(H) 5.2(H) 4.5(H)  Alkaline Phos 38 - 126 U/L 140(H) 160(H) 150(H)  AST 15 - 41 U/L 142(H) 343(H) 847(H)  ALT 0 - 44 U/L 365(H) 554(H) 717(H)   Radiology review: Images from recent ERCP reviewed.  Imaging studies: No new pertinent imaging studies   Assessment/Plan:  70 y.o. female with choledocholithiasis s/p successful sphincterotomy with common duct clearance for same, complicated by pertinent comorbidities including: Patient Active Problem List   Diagnosis Date Noted  . Choledocholithiasis 09/01/2020  . Elevated liver enzymes 09/01/2020  . Hyperlipidemia, mixed 09/01/2020     -We discussed our plan to proceed with robotic cholecystectomy tomorrow.  - Risks and benefits have been discussed with the patient which include but are not limited to anesthesia, bleeding, infection, biliary ductal injury or stenosis, other associated unanticipated injuries affiliated with laparoscopic surgery.  I believe there is the desire to proceed, interpreter utilized as needed.  Questions elicited and answered to satisfaction.  No guarantees ever expressed or implied.   All  of the above findings and recommendations were discussed with the patient, patient's family, and the medical team, and all of patient's and family's questions were answered to their expressed satisfaction.   -- Ronny Bacon M.D., [facility] 09/03/2020 1:58 PM

## 2020-09-03 NOTE — Op Note (Signed)
[facility] Gastroenterology Patient Name: Candice Leblanc Procedure Date: 09/03/2020 11:38 AM MRN: [id number] Account #: [id number] Date of Birth: Nov 11, 1950 Admit Type: Outpatient Age: 70 Room: [facility] ENDO ROOM 4 Gender: Female Note Status: Finalized Procedure:             ERCP Indications:           Common bile duct stone(s) Providers:             Lucilla Lame MD, MD Referring MD:          Dion Body (Referring MD) Medicines:             Propofol per Anesthesia Complications:         No immediate complications. Procedure:             Pre-Anesthesia Assessment:                        - Prior to the procedure, a History and Physical was                         performed, and patient medications and allergies were                         reviewed. The patient's tolerance of previous                         anesthesia was also reviewed. The risks and benefits                         of the procedure and the sedation options and risks                         were discussed with the patient. All questions were                         answered, and informed consent was obtained. Prior                         Anticoagulants: The patient has taken no previous                         anticoagulant or antiplatelet agents. ASA Grade                         Assessment: II - A patient with mild systemic disease.                         After reviewing the risks and benefits, the patient                         was deemed in satisfactory condition to undergo the                         procedure.                        After obtaining informed consent, the scope was passed  under direct vision. Throughout the procedure, the                         patient's blood pressure, pulse, and oxygen                         saturations were monitored continuously. The [employer] D single use duodenoscope was                          introduced through the mouth, and used to inject                         contrast into and used to inject contrast into the                         bile duct. The ERCP was accomplished without                         difficulty. The patient tolerated the procedure well. Findings:      The scout film was normal. The esophagus was successfully intubated       under direct vision. The scope was advanced to a normal major papilla in       the descending duodenum without detailed examination of the pharynx,       larynx and associated structures, and upper GI tract. The upper GI tract       was grossly normal. The bile duct was deeply cannulated with the       short-nosed traction sphincterotome. Contrast was injected. I personally       interpreted the bile duct images. There was brisk flow of contrast       through the ducts. Image quality was excellent. Contrast extended to the       entire biliary tree. A wire was passed into the biliary tree. A 5 mm       biliary sphincterotomy was made with a traction (standard)       sphincterotome using ERBE electrocautery. There was no       post-sphincterotomy bleeding. The biliary tree was swept with a 15 mm       balloon starting at the bifurcation. Many stones were removed. No stones       remained. Impression:            - Choledocholithiasis was found. Complete removal was                         accomplished by biliary sphincterotomy and balloon                         extraction.                        - A biliary sphincterotomy was performed.                        - The biliary tree was swept. Recommendation:        - Return patient to hospital ward for ongoing care.                        -  Clear liquid diet.                        - Watch for pancreatitis, bleeding, perforation, and                         cholangitis. Procedure Code(s):     --- Professional ---                        [phone], Endoscopic retrograde  cholangiopancreatography                         (ERCP); with removal of calculi/debris from                         biliary/pancreatic duct(s)                        43262, Endoscopic retrograde cholangiopancreatography                         (ERCP); with sphincterotomy/papillotomy                        [phone], Endoscopic catheterization of the biliary                         ductal system, radiological supervision and                         interpretation Diagnosis Code(s):     --- Professional ---                        K80.50, Calculus of bile duct without cholangitis or                         cholecystitis without obstruction CPT copyright 2019 American Medical Association. All rights reserved. The codes documented in this report are preliminary and upon coder review may  be revised to meet current compliance requirements. Lucilla Lame MD, MD 09/03/2020 12:21:07 PM This report has been signed electronically. Number of Addenda: 0 Note Initiated On: 09/03/2020 11:38 AM Estimated Blood Loss:  Estimated blood loss: none.      [facility]

## 2020-09-03 NOTE — Progress Notes (Signed)
Mobility Specialist - Progress Note   09/03/20 1447  Mobility  Activity Ambulated in hall  Level of Assistance Independent  Assistive Device None  Distance Ambulated (ft) 180 ft  Mobility Ambulated independently in hallway  Mobility Response Tolerated well  Mobility performed by Mobility specialist  $Mobility charge 1 Mobility    Pre-mobility: 78 HR, 98% SpO2 During mobility: 90 HR, 93% SpO2 Post-mobility: 83 HR, 96% SpO2   Pt ambulated in hallway without AD. No LOB. Denied dizziness and pain. Denied SOB on RA. Family at bedside.    Kathee Delton Mobility Specialist 09/03/20, 2:48 PM

## 2020-09-03 NOTE — Anesthesia Procedure Notes (Signed)
Performed by: Vaughan Sine Pre-anesthesia Checklist: Patient identified, Emergency Drugs available, Patient being monitored, Suction available and Timeout performed Oxygen Delivery Method: Nasal cannula Preoxygenation: Pre-oxygenation with 100% oxygen Induction Type: IV induction Airway Equipment and Method: Bite block Placement Confirmation: CO2 detector and positive ETCO2

## 2020-09-04 ENCOUNTER — Encounter: Payer: Self-pay | Admitting: Gastroenterology

## 2020-09-04 ENCOUNTER — Inpatient Hospital Stay: Payer: Medicare HMO | Admitting: Anesthesiology

## 2020-09-04 ENCOUNTER — Encounter
Admission: EM | Disposition: A | Discharge status: Home / Self Care | Payer: Self-pay | Source: Home / Self Care | Attending: Internal Medicine

## 2020-09-04 DIAGNOSIS — K8010 Calculus of gallbladder with chronic cholecystitis without obstruction: Secondary | ICD-10-CM

## 2020-09-04 LAB — HEPATIC FUNCTION PANEL
ALT: 248 U/L — ABNORMAL HIGH (ref 0–44)
AST: 56 U/L — ABNORMAL HIGH (ref 15–41)
Albumin: 3.6 g/dL (ref 3.5–5.0)
Alkaline Phosphatase: 117 U/L (ref 38–126)
Bilirubin, Direct: 0.4 mg/dL — ABNORMAL HIGH (ref 0.0–0.2)
Indirect Bilirubin: 1.7 mg/dL — ABNORMAL HIGH (ref 0.3–0.9)
Total Bilirubin: 2.1 mg/dL — ABNORMAL HIGH (ref 0.3–1.2)
Total Protein: 6.8 g/dL (ref 6.5–8.1)

## 2020-09-04 LAB — LIPASE, BLOOD: Lipase: 32 U/L (ref 11–51)

## 2020-09-04 LAB — SURGICAL PATHOLOGY

## 2020-09-04 SURGERY — CHOLECYSTECTOMY, ROBOT-ASSISTED, LAPAROSCOPIC
Anesthesia: General | Site: Abdomen

## 2020-09-04 MED ORDER — MIDAZOLAM HCL 2 MG/2ML IJ SOLN
INTRAMUSCULAR | Status: DC | PRN
  Administered 2020-09-04: 2 mg via INTRAVENOUS

## 2020-09-04 MED ORDER — FENTANYL CITRATE (PF) 100 MCG/2ML IJ SOLN
INTRAMUSCULAR | Status: AC
  Filled 2020-09-04: qty 2

## 2020-09-04 MED ORDER — FENTANYL CITRATE (PF) 100 MCG/2ML IJ SOLN
INTRAMUSCULAR | Status: AC
  Administered 2020-09-04: 25 ug via INTRAVENOUS
  Filled 2020-09-04: qty 2

## 2020-09-04 MED ORDER — LIDOCAINE HCL (CARDIAC) PF 100 MG/5ML IV SOSY
PREFILLED_SYRINGE | INTRAVENOUS | Status: DC | PRN
  Administered 2020-09-04: 80 mg via INTRAVENOUS

## 2020-09-04 MED ORDER — SUGAMMADEX SODIUM 200 MG/2ML IV SOLN
INTRAVENOUS | Status: DC | PRN
  Administered 2020-09-04: 200 mg via INTRAVENOUS

## 2020-09-04 MED ORDER — KCL IN DEXTROSE-NACL 20-5-0.9 MEQ/L-%-% IV SOLN
INTRAVENOUS | Status: DC
  Filled 2020-09-04: qty 1000

## 2020-09-04 MED ORDER — FENTANYL CITRATE (PF) 100 MCG/2ML IJ SOLN: 25.0000 ug | INTRAMUSCULAR | Status: DC | PRN

## 2020-09-04 MED ORDER — LABETALOL HCL 5 MG/ML IV SOLN
INTRAVENOUS | Status: AC
  Filled 2020-09-04: qty 4

## 2020-09-04 MED ORDER — DEXAMETHASONE SODIUM PHOSPHATE 10 MG/ML IJ SOLN
INTRAMUSCULAR | Status: DC | PRN
  Administered 2020-09-04: 10 mg via INTRAVENOUS

## 2020-09-04 MED ORDER — HEPARIN SODIUM (PORCINE) 5000 UNIT/ML IJ SOLN
5000.0000 [IU] | Freq: Three times a day (TID) | INTRAMUSCULAR | Status: DC
  Administered 2020-09-05: 5000 [IU] via SUBCUTANEOUS
  Filled 2020-09-04: qty 1

## 2020-09-04 MED ORDER — FENTANYL CITRATE (PF) 100 MCG/2ML IJ SOLN
INTRAMUSCULAR | Status: DC | PRN
  Administered 2020-09-04 (×3): 50 ug via INTRAVENOUS

## 2020-09-04 MED ORDER — MIDAZOLAM HCL 2 MG/2ML IJ SOLN
INTRAMUSCULAR | Status: AC
  Filled 2020-09-04: qty 2

## 2020-09-04 MED ORDER — ONDANSETRON HCL 4 MG/2ML IJ SOLN
INTRAMUSCULAR | Status: DC | PRN
  Administered 2020-09-04: 4 mg via INTRAVENOUS

## 2020-09-04 MED ORDER — ROCURONIUM BROMIDE 100 MG/10ML IV SOLN
INTRAVENOUS | Status: DC | PRN
  Administered 2020-09-04: 50 mg via INTRAVENOUS

## 2020-09-04 MED ORDER — ONDANSETRON HCL 4 MG/2ML IJ SOLN: 4.0000 mg | Freq: Once | INTRAMUSCULAR | Status: DC | PRN

## 2020-09-04 MED ORDER — SODIUM CHLORIDE 0.9 % IV SOLN: INTRAVENOUS | Status: DC

## 2020-09-04 MED ORDER — LACTATED RINGERS IV SOLN: INTRAVENOUS | Status: DC | PRN

## 2020-09-04 MED ORDER — KETOROLAC TROMETHAMINE 30 MG/ML IJ SOLN
INTRAMUSCULAR | Status: AC
  Filled 2020-09-04: qty 1

## 2020-09-04 MED ORDER — LABETALOL HCL 5 MG/ML IV SOLN
INTRAVENOUS | Status: DC | PRN
  Administered 2020-09-04 (×3): 5 mg via INTRAVENOUS

## 2020-09-04 MED ORDER — PHENYLEPHRINE HCL (PRESSORS) 10 MG/ML IV SOLN
INTRAVENOUS | Status: DC | PRN
  Administered 2020-09-04 (×2): 100 ug via INTRAVENOUS

## 2020-09-04 MED ORDER — PROPOFOL 10 MG/ML IV BOLUS
INTRAVENOUS | Status: DC | PRN
  Administered 2020-09-04: 50 mg via INTRAVENOUS
  Administered 2020-09-04: 150 mg via INTRAVENOUS

## 2020-09-04 MED ORDER — ROCURONIUM BROMIDE 10 MG/ML (PF) SYRINGE
PREFILLED_SYRINGE | INTRAVENOUS | Status: AC
  Filled 2020-09-04: qty 10

## 2020-09-04 MED ORDER — BUPIVACAINE-EPINEPHRINE (PF) 0.25% -1:200000 IJ SOLN
INTRAMUSCULAR | Status: DC | PRN
  Administered 2020-09-04: 26 mL

## 2020-09-04 MED ORDER — ATORVASTATIN CALCIUM 20 MG PO TABS
30.0000 mg | ORAL_TABLET | Freq: Every day | ORAL | Status: DC
  Filled 2020-09-04: qty 1

## 2020-09-04 MED ORDER — HYDROCODONE-ACETAMINOPHEN 5-325 MG PO TABS
1.0000 | ORAL_TABLET | ORAL | Status: DC | PRN
  Administered 2020-09-05: 1 via ORAL
  Filled 2020-09-04: qty 1

## 2020-09-04 MED ORDER — BUPIVACAINE LIPOSOME 1.3 % IJ SUSP
INTRAMUSCULAR | Status: AC
  Filled 2020-09-04: qty 20

## 2020-09-04 MED ORDER — BUPIVACAINE-EPINEPHRINE (PF) 0.25% -1:200000 IJ SOLN
INTRAMUSCULAR | Status: AC
  Filled 2020-09-04: qty 30

## 2020-09-04 MED ORDER — CEFAZOLIN SODIUM-DEXTROSE 2-3 GM-%(50ML) IV SOLR
INTRAVENOUS | Status: DC | PRN
  Administered 2020-09-04: 2 g via INTRAVENOUS

## 2020-09-04 SURGICAL SUPPLY — 46 items
CANISTER SUCT 1200ML W/VALVE (MISCELLANEOUS) IMPLANT
CANNULA CAP OBTURATR AIRSEAL 8 (CAP) ×2 IMPLANT
CHLORAPREP W/TINT 26 (MISCELLANEOUS) ×2 IMPLANT
CLIP VESOLOCK LG 6/CT PURPLE (CLIP) ×2 IMPLANT
COVER TIP SHEARS 8 DVNC (MISCELLANEOUS) ×1 IMPLANT
COVER TIP SHEARS 8MM DA VINCI (MISCELLANEOUS) ×1
COVER WAND RF STERILE (DRAPES) IMPLANT
DECANTER SPIKE VIAL GLASS SM (MISCELLANEOUS) IMPLANT
DEFOGGER SCOPE WARMER CLEARIFY (MISCELLANEOUS) ×2 IMPLANT
DERMABOND ADVANCED (GAUZE/BANDAGES/DRESSINGS) ×1
DERMABOND ADVANCED .7 DNX12 (GAUZE/BANDAGES/DRESSINGS) ×1 IMPLANT
DRAPE ARM DVNC X/XI (DISPOSABLE) ×4 IMPLANT
DRAPE COLUMN DVNC XI (DISPOSABLE) ×1 IMPLANT
DRAPE DA VINCI XI ARM (DISPOSABLE) ×4
DRAPE DA VINCI XI COLUMN (DISPOSABLE) ×1
ELECT CAUTERY BLADE 6.4 (BLADE) ×2 IMPLANT
GLOVE SURG ORTHO LTX SZ7.5 (GLOVE) ×4 IMPLANT
GOWN STRL REUS W/ TWL LRG LVL3 (GOWN DISPOSABLE) ×4 IMPLANT
GOWN STRL REUS W/TWL LRG LVL3 (GOWN DISPOSABLE) ×4
GRASPER SUT TROCAR 14GX15 (MISCELLANEOUS) IMPLANT
INFUSOR MANOMETER BAG 3000ML (MISCELLANEOUS) IMPLANT
IRRIGATION STRYKERFLOW (MISCELLANEOUS) IMPLANT
IRRIGATOR STRYKERFLOW (MISCELLANEOUS)
IRRIGATOR SUCT 8 DISP DVNC XI (IRRIGATION / IRRIGATOR) IMPLANT
IRRIGATOR SUCTION 8MM XI DISP (IRRIGATION / IRRIGATOR)
IV NS IRRIG 3000ML ARTHROMATIC (IV SOLUTION) IMPLANT
KIT PINK PAD W/HEAD ARE REST (MISCELLANEOUS) ×2
KIT PINK PAD W/HEAD ARM REST (MISCELLANEOUS) ×1 IMPLANT
KIT TURNOVER KIT A (KITS) ×2 IMPLANT
LABEL OR SOLS (LABEL) ×2 IMPLANT
MANIFOLD NEPTUNE II (INSTRUMENTS) ×2 IMPLANT
NEEDLE HYPO 22GX1.5 SAFETY (NEEDLE) ×2 IMPLANT
NEEDLE INSUFFLATION 14GA 120MM (NEEDLE) IMPLANT
NS IRRIG 500ML POUR BTL (IV SOLUTION) ×2 IMPLANT
PACK LAP CHOLECYSTECTOMY (MISCELLANEOUS) ×2 IMPLANT
PENCIL ELECTRO HAND CTR (MISCELLANEOUS) ×2 IMPLANT
POUCH SPECIMEN RETRIEVAL 10MM (ENDOMECHANICALS) ×2 IMPLANT
SEAL CANN UNIV 5-8 DVNC XI (MISCELLANEOUS) ×3 IMPLANT
SEAL XI 5MM-8MM UNIVERSAL (MISCELLANEOUS) ×3
SET TUBE FILTERED XL AIRSEAL (SET/KITS/TRAYS/PACK) ×2 IMPLANT
SOLUTION ELECTROLUBE (MISCELLANEOUS) ×2 IMPLANT
SUT MNCRL 4-0 (SUTURE) ×1
SUT MNCRL 4-0 27XMFL (SUTURE) ×1
SUT VICRYL 0 AB UR-6 (SUTURE) ×2 IMPLANT
SUTURE MNCRL 4-0 27XMF (SUTURE) ×1 IMPLANT
TROCAR Z-THREAD FIOS 11X100 BL (TROCAR) ×2 IMPLANT

## 2020-09-04 NOTE — Progress Notes (Signed)
[facility] Day(s): 1.   Post op day(s): 1 Day Post-Op.   Interval History:  Patient seen and examined No acute events or new complaints overnight.  Patient reports she is doing well, no abdominal pain No fever, chills, nausea, emesis Labs show improvement in LFTs and hyperbilirubinemia; no evidence of pancreatitis She is currently NPO Plan today for robotic assisted laparoscopic cholecystectomy with Dr Christian Mate  Vital signs in last 24 hours: [min-max] current  Temp:  [96.5 F (35.8 C)-98.6 F (37 C)] 98.5 F (36.9 C) (05/13 0414) Pulse Rate:  [64-95] 64 (05/13 0414) Resp:  [12-20] 20 (05/13 0414) BP: (97-165)/(46-78) 106/51 (05/13 0414) SpO2:  [96 %-100 %] 96 % (05/13 0414)     Height: 5\' 6"  (167.6 cm) Weight: 68.9 kg BMI (Calculated): 24.55   Intake/Output last 2 shifts:  No intake/output data recorded.   Physical Exam:  Constitutional: alert, cooperative and no distress  Respiratory: breathing non-labored at rest  Cardiovascular: regular rate and sinus rhythm  Gastrointestinal: soft, non-tender, and non-distended, no rebound/guarding Integumentary: warm, dry  Labs:  CBC Latest Ref Rng & Units 09/02/2020 09/01/2020 04/08/2014  WBC 4.0 - 10.5 K/uL 5.8 9.6 8.2  Hemoglobin 12.0 - 15.0 g/dL 12.3 14.8 12.7  Hematocrit 36.0 - 46.0 % 37.4 44.5 39.8  Platelets 150 - 400 K/uL 308 281 321   CMP Latest Ref Rng & Units 09/04/2020 09/03/2020 09/02/2020  Glucose 70 - 99 mg/dL - 88 101(H)  BUN 8 - 23 mg/dL - 12 14  Creatinine 0.44 - 1.00 mg/dL - 0.93 0.92  Sodium 135 - 145 mmol/L - 138 133(L)  Potassium 3.5 - 5.1 mmol/L - 3.6 3.2(L)  Chloride 98 - 111 mmol/L - 104 101  CO2 22 - 32 mmol/L - 24 24  Calcium 8.9 - 10.3 mg/dL - 8.9 8.9  Total Protein 6.5 - 8.1 g/dL 6.8 7.1 7.1  Total Bilirubin 0.3 - 1.2 mg/dL 2.1(H) 3.0(H) 5.2(H)  Alkaline Phos 38 - 126 U/L 117 140(H) 160(H)  AST 15 - 41 U/L 56(H) 142(H) 343(H)  ALT 0 - 44 U/L 248(H)  365(H) 554(H)     Imaging studies: No new pertinent imaging studies   Assessment/Plan:  70 y.o. female with choledocholithiasis s/p ERCP (05/12).    - Will plan on robotic assisted laparoscopic cholecystectomy this afternoon with Dr Christian Mate pending OR/anesthesia availability  - All risks, benefits, and alternatives to above procedure(s) were discussed with the patient, all of her questions were answered to her expressed satisfaction, patient expresses she wishes to proceed, and informed consent was obtained.  - NPO + IVF Resuscitation   - Monitor abdominal examination             - Pain control prn; antiemetics prn                    - Mobilize as tolerated              - Further management per primary service; we will follow    All of the above findings and recommendations were discussed with the patient, and the medical team, and all of patient's questions were answered to her expressed satisfaction.  -- Edison Simon, PA-C [facility] 09/04/2020, 7:19 AM [phone] M-F: 7am - 4pm

## 2020-09-04 NOTE — Anesthesia Postprocedure Evaluation (Signed)
Anesthesia Post Note  Patient: Candice Leblanc  Procedure(s) Performed: XI ROBOTIC ASSISTED LAPAROSCOPIC CHOLECYSTECTOMY (N/A Abdomen)  Patient location during evaluation: PACU Anesthesia Type: General Level of consciousness: awake and alert, awake and oriented Pain management: pain level controlled Vital Signs Assessment: post-procedure vital signs reviewed and stable Respiratory status: spontaneous breathing, nonlabored ventilation and respiratory function stable Cardiovascular status: blood pressure returned to baseline and stable Postop Assessment: no apparent nausea or vomiting Anesthetic complications: no   No complications documented.   Last Vitals:  Vitals:   09/04/20 1445 09/04/20 1446  BP: (!) 157/65 (!) 157/65  Pulse: 78 75  Resp: 15 14  Temp:    SpO2: 99% 97%    Last Pain:  Vitals:   09/04/20 1446  TempSrc:   PainSc: Asleep                 Phill Mutter

## 2020-09-04 NOTE — Anesthesia Procedure Notes (Signed)
Procedure Name: Intubation Date/Time: 09/04/2020 1:07 PM Performed by: Leander Rams, CRNA Pre-anesthesia Checklist: Patient identified, Emergency Drugs available, Suction available and Patient being monitored Patient Re-evaluated:Patient Re-evaluated prior to induction Oxygen Delivery Method: Circle system utilized Preoxygenation: Pre-oxygenation with 100% oxygen Induction Type: IV induction Ventilation: Mask ventilation without difficulty Laryngoscope Size: McGraph and 3 Grade View: Grade I Tube type: Oral Tube size: 7.0 mm Number of attempts: 1 Airway Equipment and Method: Stylet Placement Confirmation: ETT inserted through vocal cords under direct vision,  positive ETCO2,  CO2 detector and breath sounds checked- equal and bilateral Secured at: 20 cm Tube secured with: Tape Dental Injury: Teeth and Oropharynx as per pre-operative assessment

## 2020-09-04 NOTE — Plan of Care (Signed)
Continuing with plan of care. 

## 2020-09-04 NOTE — Discharge Instructions (Signed)
In addition to included general post-operative instructions,  Diet: Resume home diet. Recommend avoiding or limiting fatty/greasy foods over the next few days/week. If you do eat these, you may (or may not) notice diarrhea. This is expected while your body adjusts to not having a gallbladder, and it typically resolves with time.    Activity: No heavy lifting >20 pounds (children, pets, laundry, garbage) for 4 weeks, but light activity and walking are encouraged. Do not drive or drink alcohol if taking narcotic pain medications or having pain that might distract from driving.  Wound care: 2 days after surgery (05/15), you may shower/get incision wet with soapy water and pat dry (do not rub incisions), but no baths or submerging incision underwater until follow-up.   Medications: Resume all home medications. For mild to moderate pain: acetaminophen (Tylenol) or ibuprofen/naproxen (if no kidney disease). Combining Tylenol with alcohol can substantially increase your risk of causing liver disease. Narcotic pain medications, if prescribed, can be used for severe pain, though may cause nausea, constipation, and drowsiness. Do not combine Tylenol and Percocet (or similar) within a 6 hour period as Percocet (and similar) contain(s) Tylenol. If you do not need the narcotic pain medication, you do not need to fill the prescription.  Call office [phone] / [phone]) at any time if any questions, worsening pain, fevers/chills, bleeding, drainage from incision site, or other concerns.

## 2020-09-04 NOTE — Progress Notes (Addendum)
Progress Note    Candice Leblanc  VZD:[id number] DOB: Apr 02, 1951  DOA: 09/01/2020 PCP: Dion Body, MD      Brief Narrative:    Medical records reviewed and are as summarized below:  Candice Leblanc is a 70 y.o. female with past medical history significant for hyperlipidemia, GERD, borderline diabetes mellitus, remote history of breast cancer status post left mastectomy and chemotherapy in 2006.  She presented to the hospital because of nausea and abdominal pain.  Work-up revealed choledocholithiasis, cholelithiasis and elevated liver enzymes.    Assessment/Plan:   Principal Problem:   Choledocholithiasis Active Problems:   Elevated liver enzymes   Hyperlipidemia, mixed    Body mass index is 24.53 kg/m.   Abdominal pain, choledocholithiasis, cholelithiasis, elevated liver enzymes: No evidence of pancreatitis, cholecystitis or cholangitis.  S/p ERCP on 09/03/2020.  Choledocholithiasis was found and complete removal was accomplished by biliary sphincterectomy and balloon extraction.  Lipase level is normal and there is no evidence of pancreatitis.  Liver enzymes are improving.  Plan for cholecystectomy today.  She is n.p.o. so she has been started on IV fluids.  Follow-up with general surgeon.  Hypokalemia: Improved  First-degree AV block on telemetry.    Other comorbidities include hyperlipidemia, GERD, borderline diabetes mellitus, remote history of breast cancer status post left mastectomy in 2006   Diet Order            Diet NPO time specified  Diet effective now                    Consultants:  Gastroenterologist  General surgeon  Procedures:  ERCP on 09/03/2020    Medications:   . [MAR Hold] famotidine  40 mg Oral BID  . [MAR Hold] fluticasone  2 spray Each Nare Daily   Continuous Infusions: . dextrose 5 % and 0.9 % NaCl with KCl 20 mEq/L 100 mL/hr at 09/04/20 0933     Anti-infectives (From admission, onward)    None             Family Communication/Anticipated D/C date and plan/Code Status   DVT prophylaxis: Place TED hose Start: 09/01/20 1712     Code Status: Full Code  Family Communication: None Disposition Plan:   Status is: Inpatient  Remains inpatient appropriate because:Inpatient level of care appropriate due to severity of illness   Dispo: The patient is from: Home              Anticipated d/c is to: Home              Patient currently is not medically stable to d/c.   Difficult to place patient No                Subjective:   No nausea, vomiting or abdominal pain.  Objective:    Vitals:   09/03/20 1929 09/04/20 0008 09/04/20 0414 09/04/20 1131  BP: 116/63 (!) 97/46 (!) 106/51 (!) 164/58  Pulse: 70 67 64 73  Resp: 18 17 20 14   Temp: 98.6 F (37 C) 98 F (36.7 C) 98.5 F (36.9 C) 97.7 F (36.5 C)  TempSrc:  Oral  Oral  SpO2: 98% 98% 96% 100%  Weight:      Height:       No data found.  No intake or output data in the 24 hours ending 09/04/20 1210 Filed Weights   09/01/20 1107  Weight: 68.9 kg    Exam:  GEN: NAD SKIN: No  rash EYES: EOMI ENT: MMM CV: RRR PULM: CTA B ABD: soft, ND, NT, +BS CNS: AAO x 3, non focal EXT: No edema or tenderness         Data Reviewed:   I have personally reviewed following labs and imaging studies:  Labs: Labs show the following:   Basic Metabolic Panel: Recent Labs  Lab 09/01/20 1214 09/02/20 0315 09/03/20 0308  NA 136 133* 138  K 3.8 3.2* 3.6  CL 99 101 104  CO2 26 24 24   GLUCOSE 117* 101* 88  BUN 14 14 12   CREATININE 0.82 0.92 0.93  CALCIUM 9.7 8.9 8.9  MG  --  2.1  --   PHOS  --  4.3  --    GFR Estimated Creatinine Clearance: 52.7 mL/min (by C-G formula based on SCr of 0.93 mg/dL). Liver Function Tests: Recent Labs  Lab 09/01/20 1214 09/02/20 0315 09/03/20 0308 09/04/20 0504  AST 847* 343* 142* 56*  ALT 717* 554* 365* 248*  ALKPHOS 150* 160* 140* 117  BILITOT  4.5* 5.2* 3.0* 2.1*  PROT 8.0 7.1 7.1 6.8  ALBUMIN 4.4 3.8 3.8 3.6   Recent Labs  Lab 09/01/20 1214 09/04/20 0504  LIPASE 44 32   No results for input(s): AMMONIA in the last 168 hours. Coagulation profile No results for input(s): INR, PROTIME in the last 168 hours.  CBC: Recent Labs  Lab 09/01/20 1214 09/02/20 0315  WBC 9.6 5.8  NEUTROABS 8.7*  --   HGB 14.8 12.3  HCT 44.5 37.4  MCV 88.1 86.8  PLT 281 308   Cardiac Enzymes: No results for input(s): CKTOTAL, CKMB, CKMBINDEX, TROPONINI in the last 168 hours. BNP (last 3 results) No results for input(s): PROBNP in the last 8760 hours. CBG: No results for input(s): GLUCAP in the last 168 hours. D-Dimer: No results for input(s): DDIMER in the last 72 hours. Hgb A1c: No results for input(s): HGBA1C in the last 72 hours. Lipid Profile: No results for input(s): CHOL, HDL, LDLCALC, TRIG, CHOLHDL, LDLDIRECT in the last 72 hours. Thyroid function studies: No results for input(s): TSH, T4TOTAL, T3FREE, THYROIDAB in the last 72 hours.  Invalid input(s): FREET3 Anemia work up: No results for input(s): VITAMINB12, FOLATE, FERRITIN, TIBC, IRON, RETICCTPCT in the last 72 hours. Sepsis Labs: Recent Labs  Lab 09/01/20 1214 09/02/20 0315  WBC 9.6 5.8    Microbiology Recent Results (from the past 240 hour(s))  Resp Panel by RT-PCR (Flu A&B, Covid) Nasopharyngeal Swab     Status: None   Collection Time: 09/01/20  4:53 PM   Specimen: Nasopharyngeal Swab; Nasopharyngeal(NP) swabs in vial transport medium  Result Value Ref Range Status   SARS Coronavirus 2 by RT PCR NEGATIVE NEGATIVE Final    Comment: (NOTE) SARS-CoV-2 target nucleic acids are NOT DETECTED.  The SARS-CoV-2 RNA is generally detectable in upper respiratory specimens during the acute phase of infection. The lowest concentration of SARS-CoV-2 viral copies this assay can detect is 138 copies/mL. A negative result does not preclude SARS-Cov-2 infection and should  not be used as the sole basis for treatment or other patient management decisions. A negative result may occur with  improper specimen collection/handling, submission of specimen other than nasopharyngeal swab, presence of viral mutation(s) within the areas targeted by this assay, and inadequate number of viral copies(<138 copies/mL). A negative result must be combined with clinical observations, patient history, and epidemiological information. The expected result is Negative.  Fact Sheet for Patients:  EntrepreneurPulse.com.au  Fact Sheet for  Healthcare Providers:  IncredibleEmployment.be  This test is no t yet approved or cleared by the [place] and  has been authorized for detection and/or diagnosis of SARS-CoV-2 by FDA under an Emergency Use Authorization (EUA). This EUA will remain  in effect (meaning this test can be used) for the duration of the COVID-19 declaration under Section 564(b)(1) of the Act, 21 U.S.C.section 360bbb-3(b)(1), unless the authorization is terminated  or revoked sooner.       Influenza A by PCR NEGATIVE NEGATIVE Final   Influenza B by PCR NEGATIVE NEGATIVE Final    Comment: (NOTE) The Xpert Xpress SARS-CoV-2/FLU/RSV plus assay is intended as an aid in the diagnosis of influenza from Nasopharyngeal swab specimens and should not be used as a sole basis for treatment. Nasal washings and aspirates are unacceptable for Xpert Xpress SARS-CoV-2/FLU/RSV testing.  Fact Sheet for Patients: EntrepreneurPulse.com.au  Fact Sheet for Healthcare Providers: IncredibleEmployment.be  This test is not yet approved or cleared by the [place] FDA and has been authorized for detection and/or diagnosis of SARS-CoV-2 by FDA under an Emergency Use Authorization (EUA). This EUA will remain in effect (meaning this test can be used) for the duration of the COVID-19 declaration under  Section 564(b)(1) of the Act, 21 U.S.C. section 360bbb-3(b)(1), unless the authorization is terminated or revoked.  Performed at [facility], [address] [place], [place] [address]     Procedures and diagnostic studies:  DG C-Arm 1-60 Min-No Report  Result Date: 09/03/2020 Fluoroscopy was utilized by the requesting physician.  No radiographic interpretation.               LOS: 1 day   Candice Leblanc  [facility]   Pager on [email or web address]. If 7PM-7AM, please contact night-coverage at [email or web address]     09/04/2020, 12:10 PM

## 2020-09-04 NOTE — Op Note (Signed)
Robotic cholecystectomy  Pre-operative Diagnosis: Chronic calculus cholecystitis, recent choledocholithiasis status post ERCP.  Post-operative Diagnosis:  Same.  Procedure: Robotic assisted laparoscopic cholecystectomy.  Surgeon: Ronny Bacon, M.D., FACS  Anesthesia: General. with endotracheal tube  Findings: Tubular shaped gallbladder with markedly dilated cystic duct.  Estimated Blood Loss: 5 mL         Drains: None         Specimens: Gallbladder           Complications: none  Procedure Details  The patient was seen again in the Holding Room.  2.5 mg dose of ICG was administered intravenously.   The benefits, complications, treatment options, risks and expected outcomes were again reviewed with the patient. The likelihood of improving the patient's symptoms with return to their baseline status is good.  The patient and/or family concurred with the proposed plan, giving informed consent, again alternatives reviewed.  The patient was taken to Operating Room, identified, and the procedure verified as robotic assisted laparoscopic cholecystectomy.  Prior to the induction of general anesthesia, antibiotic prophylaxis was administered. VTE prophylaxis was in place. General endotracheal anesthesia was then administered and tolerated well. The patient was positioned in the supine position.  After the induction, the abdomen was prepped with Chloraprep and draped in the sterile fashion.  A Time Out was held and the above information confirmed.  After local infiltration of quarter percent Marcaine with epinephrine, stab incision was made left upper quadrant.  Just below the costal margin at Palmer's point, approximately midclavicular line the Veres needle is passed with sensation of the layers to penetrate the abdominal wall and into the peritoneum.  Saline drop test is confirmed peritoneal placement.  Insufflation is initiated with carbon dioxide to pressures of 15 mmHg.  Right  infra-umbilical local infiltration with quarter percent Marcaine with epinephrine is utilized.  Made a 12 mm incision on the right periumbilical site, I advanced an optical 65mm port under direct visualization into the peritoneal cavity.  Once the peritoneum was penetrated, insufflation was initiated.  The trocar was then advanced into the abdominal cavity under direct visualization. Pneumoperitoneum was then continued with Air seal utilizing CO2 at 15 mmHg or less and tolerated well without any adverse changes in the patient's vital signs.  Two 8.5-mm ports were placed in the left lower quadrant and laterally, and one to the right lower quadrant, all under direct vision. All skin incisions  were infiltrated with a local anesthetic agent before making the incision and placing the trocars.  The patient was positioned  in reverse Trendelenburg, tilted the patient's left side down.  Da Vinci XI robot was then positioned on to the patient's left side, and docked.  The gallbladder was identified, the fundus grasped via the arm 4 Prograsp and retracted cephalad. Adhesions were lysed with scissors and cautery.  The infundibulum was essentially nonexistent, therefore the gallbladder neck was grasped and retracted laterally, exposing the peritoneum overlying the triangle of Calot. This was then opened and dissected using cautery & scissors. An extended critical view of the cystic duct and cystic artery was obtained, aided by the ICG via FireFly which enabled ready visualization of the ductal anatomy.     The cystic duct was clearly identified and dissected to isolation, and the cystic duct was double clipped with large clips and divided with scissors, not clipping the specimen side due to the caliber, thus leaving two on the remaining stump.      The cystic artery is sealed with bipolar  and divided with monopolar scissors.   The gallbladder was taken from the gallbladder fossa in a retrograde fashion with the  electrocautery. The gallbladder was removed and placed in an Endocatch bag.  The liver bed is inspected. Hemostasis was confirmed.  The robot was undocked and moved away from the operative field. No irrigation was utilized.   The gallbladder and Endocatch sac were then removed through the infraumbilical port site.   Inspection of the right upper quadrant was performed. No bleeding, bile duct injury or leak, or bowel injury was noted. The infra-umbilical port site fascia was closed with interrumpted 0 Vicryl sutures using PMI/cone under direct visualization. Pneumoperitoneum was released and ports removed.  4-0 subcuticular Monocryl was used to close the skin. Dermabond was  applied.  The patient was then extubated and brought to the recovery room in stable condition. Sponge, lap, and needle counts were correct at closure and at the conclusion of the case.               Ronny Bacon, M.D., [facility] 09/04/2020 2:17 PM

## 2020-09-04 NOTE — Anesthesia Preprocedure Evaluation (Signed)
Anesthesia Evaluation  Patient identified by MRN, date of birth, ID band Patient awake    Reviewed: Allergy & Precautions, H&P , NPO status , Patient's Chart, lab work & pertinent test results, reviewed documented beta blocker date and time   Airway Mallampati: II  TM Distance: >3 FB Neck ROM: full    Dental  (+) Teeth Intact   Pulmonary neg pulmonary ROS, former smoker,    Pulmonary exam normal        Cardiovascular Exercise Tolerance: Good negative cardio ROS Normal cardiovascular exam Rhythm:regular Rate:Normal     Neuro/Psych negative neurological ROS  negative psych ROS   GI/Hepatic Neg liver ROS, GERD  Medicated,  Endo/Other  negative endocrine ROSdiabetes, Well Controlled, Type 2  Renal/GU negative Renal ROS  negative genitourinary   Musculoskeletal   Abdominal   Peds  Hematology negative hematology ROS (+)   Anesthesia Other Findings Past Medical History: 2006: Breast cancer ([place])     Comment:  LT MASTECTOMY No date: Diabetes mellitus without complication (HCC)     Comment:  borderline No date: GERD (gastroesophageal reflux disease) No date: Hyperlipidemia 2006: S/P chemotherapy, time since greater than 12 weeks     Comment:  BREAST CA No date: Vitamin D deficiency Past Surgical History: 2000?: BREAST EXCISIONAL BIOPSY; Right     Comment:  EXCISIONAL - NEG 04/13/2015: COLONOSCOPY WITH PROPOFOL; N/A     Comment:  Procedure: COLONOSCOPY WITH PROPOFOL;  Surgeon: Hulen Luster, MD;  Location: [facility] ENDOSCOPY;  Service:               Gastroenterology;  Laterality: N/A; 09/03/2020: ERCP; N/A     Comment:  Procedure: ENDOSCOPIC RETROGRADE               CHOLANGIOPANCREATOGRAPHY (ERCP);  Surgeon: Lucilla Lame,               MD;  Location: [facility] ENDOSCOPY;  Service: Endoscopy;                Laterality: N/A; 2006: MASTECTOMY; Left     Comment:  breast ca BMI    Body Mass Index: 24.53 kg/m      Reproductive/Obstetrics negative OB ROS                             Anesthesia Physical Anesthesia Plan  ASA: II  Anesthesia Plan: General ETT   Post-op Pain Management:    Induction:   PONV Risk Score and Plan:   Airway Management Planned:   Additional Equipment:   Intra-op Plan:   Post-operative Plan:   Informed Consent: I have reviewed the patients History and Physical, chart, labs and discussed the procedure including the risks, benefits and alternatives for the proposed anesthesia with the patient or authorized representative who has indicated his/her understanding and acceptance.     Dental Advisory Given  Plan Discussed with: CRNA  Anesthesia Plan Comments:         Anesthesia Quick Evaluation

## 2020-09-04 NOTE — Transfer of Care (Signed)
Immediate Anesthesia Transfer of Care Note  Patient: Candice Leblanc  Procedure(s) Performed: XI ROBOTIC ASSISTED LAPAROSCOPIC CHOLECYSTECTOMY (N/A Abdomen)  Patient Location: PACU  Anesthesia Type:General  Level of Consciousness: awake  Airway & Oxygen Therapy: Patient Spontanous Breathing  Post-op Assessment: Report given to RN  Post vital signs: stable  Last Vitals:  Vitals Value Taken Time  BP 159/69 09/04/20 1430  Temp 36.3 C 09/04/20 1427  Pulse 76 09/04/20 1432  Resp 7 09/04/20 1432  SpO2 96 % 09/04/20 1432  Vitals shown include unvalidated device data.  Last Pain:  Vitals:   09/04/20 1427  TempSrc:   PainSc: Asleep         Complications: No complications documented.

## 2020-09-04 NOTE — Progress Notes (Signed)
Mobility Specialist - Progress Note   09/04/20 1600  Mobility  Activity Refused mobility  Mobility performed by Mobility specialist    Declined mobility, pt just returning from procedure and would like to rest at this time. Will attempt session another date/time.   Kathee Delton Mobility Specialist 09/04/20, 4:06 PM

## 2020-09-05 MED ORDER — ACETAMINOPHEN 325 MG PO TABS: 650.0000 mg | ORAL_TABLET | ORAL | Status: DC | PRN

## 2020-09-05 MED ORDER — IBUPROFEN 600 MG PO TABS: 600.0000 mg | ORAL_TABLET | Freq: Three times a day (TID) | ORAL | 1 refills | Status: AC | PRN

## 2020-09-05 MED ORDER — ACETAMINOPHEN 500 MG PO TABS: 1000.0000 mg | ORAL_TABLET | Freq: Four times a day (QID) | ORAL | Status: AC | PRN

## 2020-09-05 MED ORDER — OXYCODONE HCL 5 MG PO TABS: 5.0000 mg | ORAL_TABLET | Freq: Four times a day (QID) | ORAL | 0 refills | Status: DC | PRN

## 2020-09-05 NOTE — Plan of Care (Signed)
Discharge teaching completed with patient who is in stable condition. 

## 2020-09-05 NOTE — Plan of Care (Signed)
Prn pain med adm for abdominal pain, effective. Pt independent in the room. Pt wishes to go home today. Problem: Education: Goal: Knowledge of General Education information will improve Description: Including pain rating scale, medication(s)/side effects and non-pharmacologic comfort measures Outcome: Progressing   Problem: Health Behavior/Discharge Planning: Goal: Ability to manage health-related needs will improve Outcome: Progressing   Problem: Clinical Measurements: Goal: Ability to maintain clinical measurements within normal limits will improve Outcome: Progressing Goal: Will remain free from infection Outcome: Progressing Goal: Diagnostic test results will improve Outcome: Progressing Goal: Respiratory complications will improve Outcome: Progressing Goal: Cardiovascular complication will be avoided Outcome: Progressing   Problem: Activity: Goal: Risk for activity intolerance will decrease Outcome: Progressing   Problem: Nutrition: Goal: Adequate nutrition will be maintained Outcome: Progressing   Problem: Coping: Goal: Level of anxiety will decrease Outcome: Progressing   Problem: Elimination: Goal: Will not experience complications related to bowel motility Outcome: Progressing Goal: Will not experience complications related to urinary retention Outcome: Progressing   Problem: Pain Managment: Goal: General experience of comfort will improve Outcome: Progressing   Problem: Safety: Goal: Ability to remain free from injury will improve Outcome: Progressing   Problem: Skin Integrity: Goal: Risk for impaired skin integrity will decrease Outcome: Progressing

## 2020-09-05 NOTE — Discharge Summary (Signed)
Physician Discharge Summary  Candice Leblanc ZOX:[id number] DOB: 09/01/1950 DOA: 09/01/2020  PCP: Dion Body, MD  Admit date: 09/01/2020 Discharge date: 09/05/2020  Discharge disposition: Home   Recommendations for Outpatient Follow-Up:   Follow-up with PCP in 1 week. Follow-up with general surgeon in 2 weeks   Discharge Diagnosis:   Principal Problem:   Choledocholithiasis Active Problems:   Elevated liver enzymes   Hyperlipidemia, mixed    Discharge Condition: Stable.  Diet recommendation:  Diet Order            Diet - low sodium heart healthy           Diet clear liquid Room service appropriate? Yes; Fluid consistency: Thin  Diet effective now                   Code Status: Full Code     Hospital Course:   Ms.Candice Leblanc is a 70 y.o. female with past medical history significant for hyperlipidemia, GERD, borderline diabetes mellitus, remote history of breast cancer status post left mastectomy and chemotherapy in 2006.  She presented to the hospital because of nausea and abdominal pain.  Work-up revealed choledocholithiasis, cholelithiasis and elevated liver enzymes. She was treated with analgesics, IV fluids.  She also had hypokalemia and was treated.  She had first-degree AV block on telemetry.  She was asymptomatic from this.  She underwent ERCP which revealed choledocholithiasis that was removed by biliary sphincterotomy and balloon extraction.  The following day, she underwent laparoscopic cholecystectomy.  Her condition has improved and she is deemed stable for discharge.  Discharge plan was discussed with the patient and her son at the bedside.   Medical Consultants:    Gastroenterologist  General surgeon   Discharge Exam:    Vitals:   09/04/20 1620 09/04/20 2207 09/04/20 2311 09/05/20 0418  BP: (!) 150/67 132/65 (!) 111/55 127/60  Pulse: 62 72 74 74  Resp: 16 18 18 18   Temp: 98 F (36.7 C) 97.8 F (36.6 C) 98.6  F (37 C) 98.6 F (37 C)  TempSrc: Oral Oral Oral Oral  SpO2: 97% 97% 100% 96%  Weight:      Height:         GEN: NAD SKIN: Warm and dry EYES: No pallor or icterus ENT: MMM CV: RRR PULM: CTA B ABD: soft, ND, NT, +BS, small multiple incisions on the abdomen look clean, dry and intact. CNS: AAO x 3, non focal EXT: No edema or tenderness   The results of significant diagnostics from this hospitalization (including imaging, microbiology, ancillary and laboratory) are listed below for reference.     Procedures and Diagnostic Studies:   CT ABDOMEN PELVIS WO CONTRAST  Result Date: 09/01/2020 CLINICAL DATA:  Epigastric pain since last night. Patient denies nausea, vomiting and diarrhea. Elevated liver function studies. Biliary obstruction suspected. EXAM: CT ABDOMEN AND PELVIS WITHOUT CONTRAST TECHNIQUE: Multidetector CT imaging of the abdomen and pelvis was performed following the standard protocol without IV contrast. COMPARISON:  None. FINDINGS: Lower chest: Mild linear atelectasis or scarring in the right lower lobe. The lung bases are otherwise clear. Trace pericardial fluid with mild coronary and aortic atherosclerosis. Hepatobiliary: No focal hepatic abnormalities are identified on noncontrast imaging. The gallbladder is incompletely distended. No evidence of gallstones, gallbladder wall thickening or pericholecystic fluid. There is mild intra and extrahepatic biliary dilatation with the common hepatic duct measuring 9 mm in diameter. No calcified intraductal calculi are visualized. Pancreas: No evidence of pancreatic head  or ampullary mass. There is no pancreatic ductal dilatation, surrounding inflammation or fluid collection. Spleen: Normal in size without focal abnormality. Adrenals/Urinary Tract: Both adrenal glands appear normal. There are 2 prominent water density cysts in the interpolar region of the left kidney measuring up to 4.1 cm in diameter. Posteriorly in the mid left kidney,  there is a subcentimeter hyperdense lesion which is likely a small hemorrhagic cysts. No evidence of urinary tract calculus or hydronephrosis. The bladder appears normal. Stomach/Bowel: No enteric contrast administered. The stomach appears unremarkable for its degree of distension. No evidence of bowel wall thickening, distention or surrounding inflammatory change. The appendix appears normal. Vascular/Lymphatic: There are no enlarged abdominal or pelvic lymph nodes. Aortic and branch vessel atherosclerosis without acute vascular findings on noncontrast imaging. Reproductive: There is a 5.6 x 4.5 cm left adnexal mass on image 60/2. This appears homogeneous in density and abuts the uterus, possibly reflecting an exophytic fibroid. The uterus otherwise appears normal. No right adnexal mass or pelvic inflammatory changes. Other: No evidence of abdominal wall mass or hernia. No ascites. Musculoskeletal: No acute or significant osseous findings. Lumbar spine facet arthropathy. IMPRESSION: 1. Mild intrahepatic and extrahepatic biliary dilatation of undetermined etiology. No calcified stones are identified. Consider right upper quadrant abdominal ultrasound or MRCP for further evaluation. 2. No evidence of pancreatic or ampullary lesion. No pancreatic ductal dilatation. 3. Indeterminate left adnexal lesion, potentially an exophytic fibroid. This is incompletely evaluated by this noncontrast study, and further evaluation with pelvic ultrasound recommended. 4.  Aortic Atherosclerosis (ICD10-I70.0). Electronically Signed   By: Richardean Sale M.D.   On: 09/01/2020 15:22   MR 3D Recon At Scanner  Result Date: 09/02/2020 CLINICAL DATA:  70 year old female with acute onset epigastric pain and burning. Gallstone noted on ultrasound. EXAM: MRI ABDOMEN WITHOUT AND WITH CONTRAST (INCLUDING MRCP) TECHNIQUE: Multiplanar multisequence MR imaging of the abdomen was performed both before and after the administration of intravenous  contrast. Heavily T2-weighted images of the biliary and pancreatic ducts were obtained, and three-dimensional MRCP images were rendered by post processing. CONTRAST:  27mL GADAVIST GADOBUTROL 1 MMOL/ML IV SOLN COMPARISON:  CT 09/01/2020 ultrasound 22 FINDINGS: Lower chest:  Lung bases are clear. Hepatobiliary: Nosignificant intrahepatic duct dilatation. The common hepatic duct and common bile ducts are mildly dilated with the common bile duct measuring 9 mm. There are multiple filling defects within the common bile duct over several cm leading up to the ampulla. Filling defects are well seen on coronal image 16/series 3. The gallbladder is contracted around multiple small stones similar to the stones in the common bile duct. Stones measure approximately 2-3 mm each. No focal hepatic lesion. Pancreas: Normal pancreatic parenchymal intensity. No ductal dilatation or inflammation. Spleen: Normal spleen. Adrenals/urinary tract: Adrenal glands normal. Benign-appearing cyst of the RIGHT kidney. Stomach/Bowel: Stomach and limited of the small bowel is unremarkable Vascular/Lymphatic: Abdominal aortic normal caliber. No retroperitoneal periportal lymphadenopathy. Musculoskeletal: No aggressive osseous lesion IMPRESSION: 1. Multiple stones within the common bile duct (choledocholithiasis). Minimal intrahepatic duct dilatation. 2. Gallbladder contracted around multiple gallstones. 3. No evidence of pancreatitis. Electronically Signed   By: Suzy Bouchard M.D.   On: 09/02/2020 07:45   MR ABDOMEN MRCP W WO CONTAST  Result Date: 09/02/2020 CLINICAL DATA:  70 year old female with acute onset epigastric pain and burning. Gallstone noted on ultrasound. EXAM: MRI ABDOMEN WITHOUT AND WITH CONTRAST (INCLUDING MRCP) TECHNIQUE: Multiplanar multisequence MR imaging of the abdomen was performed both before and after the administration of intravenous contrast. Heavily  T2-weighted images of the biliary and pancreatic ducts were  obtained, and three-dimensional MRCP images were rendered by post processing. CONTRAST:  37mL GADAVIST GADOBUTROL 1 MMOL/ML IV SOLN COMPARISON:  CT 09/01/2020 ultrasound 22 FINDINGS: Lower chest:  Lung bases are clear. Hepatobiliary: Nosignificant intrahepatic duct dilatation. The common hepatic duct and common bile ducts are mildly dilated with the common bile duct measuring 9 mm. There are multiple filling defects within the common bile duct over several cm leading up to the ampulla. Filling defects are well seen on coronal image 16/series 3. The gallbladder is contracted around multiple small stones similar to the stones in the common bile duct. Stones measure approximately 2-3 mm each. No focal hepatic lesion. Pancreas: Normal pancreatic parenchymal intensity. No ductal dilatation or inflammation. Spleen: Normal spleen. Adrenals/urinary tract: Adrenal glands normal. Benign-appearing cyst of the RIGHT kidney. Stomach/Bowel: Stomach and limited of the small bowel is unremarkable Vascular/Lymphatic: Abdominal aortic normal caliber. No retroperitoneal periportal lymphadenopathy. Musculoskeletal: No aggressive osseous lesion IMPRESSION: 1. Multiple stones within the common bile duct (choledocholithiasis). Minimal intrahepatic duct dilatation. 2. Gallbladder contracted around multiple gallstones. 3. No evidence of pancreatitis. Electronically Signed   By: Suzy Bouchard M.D.   On: 09/02/2020 07:45   US Abdomen Limited RUQ (LIVER/GB)  Result Date: 09/01/2020 CLINICAL DATA:  Elevated bilirubin EXAM: ULTRASOUND ABDOMEN LIMITED RIGHT UPPER QUADRANT COMPARISON:  None. FINDINGS: Gallbladder: Gallbladder is decompressed with multiple stones within. No wall thickening or pericholecystic fluid is noted. Negative sonographic Murphy's sign is elicited. Common bile duct: Diameter: 7 mm Liver: No focal lesion identified. Within normal limits in parenchymal echogenicity. Portal vein is patent on color Doppler imaging with  normal direction of blood flow towards the liver. Other: None. IMPRESSION: Multiple gallstones in a decompressed gallbladder. No other focal abnormality is noted. Electronically Signed   By: Inez Catalina M.D.   On: 09/01/2020 16:41     Labs:   Basic Metabolic Panel: Recent Labs  Lab 09/01/20 1214 09/02/20 0315 09/03/20 0308  NA 136 133* 138  K 3.8 3.2* 3.6  CL 99 101 104  CO2 26 24 24   GLUCOSE 117* 101* 88  BUN 14 14 12   CREATININE 0.82 0.92 0.93  CALCIUM 9.7 8.9 8.9  MG  --  2.1  --   PHOS  --  4.3  --    GFR Estimated Creatinine Clearance: 52.7 mL/min (by C-G formula based on SCr of 0.93 mg/dL). Liver Function Tests: Recent Labs  Lab 09/01/20 1214 09/02/20 0315 09/03/20 0308 09/04/20 0504  AST 847* 343* 142* 56*  ALT 717* 554* 365* 248*  ALKPHOS 150* 160* 140* 117  BILITOT 4.5* 5.2* 3.0* 2.1*  PROT 8.0 7.1 7.1 6.8  ALBUMIN 4.4 3.8 3.8 3.6   Recent Labs  Lab 09/01/20 1214 09/04/20 0504  LIPASE 44 32   No results for input(s): AMMONIA in the last 168 hours. Coagulation profile No results for input(s): INR, PROTIME in the last 168 hours.  CBC: Recent Labs  Lab 09/01/20 1214 09/02/20 0315  WBC 9.6 5.8  NEUTROABS 8.7*  --   HGB 14.8 12.3  HCT 44.5 37.4  MCV 88.1 86.8  PLT 281 308   Cardiac Enzymes: No results for input(s): CKTOTAL, CKMB, CKMBINDEX, TROPONINI in the last 168 hours. BNP: Invalid input(s): POCBNP CBG: No results for input(s): GLUCAP in the last 168 hours. D-Dimer No results for input(s): DDIMER in the last 72 hours. Hgb A1c No results for input(s): HGBA1C in the last 72 hours. Lipid Profile  No results for input(s): CHOL, HDL, LDLCALC, TRIG, CHOLHDL, LDLDIRECT in the last 72 hours. Thyroid function studies No results for input(s): TSH, T4TOTAL, T3FREE, THYROIDAB in the last 72 hours.  Invalid input(s): FREET3 Anemia work up No results for input(s): VITAMINB12, FOLATE, FERRITIN, TIBC, IRON, RETICCTPCT in the last 72  hours. Microbiology Recent Results (from the past 240 hour(s))  Resp Panel by RT-PCR (Flu A&B, Covid) Nasopharyngeal Swab     Status: None   Collection Time: 09/01/20  4:53 PM   Specimen: Nasopharyngeal Swab; Nasopharyngeal(NP) swabs in vial transport medium  Result Value Ref Range Status   SARS Coronavirus 2 by RT PCR NEGATIVE NEGATIVE Final    Comment: (NOTE) SARS-CoV-2 target nucleic acids are NOT DETECTED.  The SARS-CoV-2 RNA is generally detectable in upper respiratory specimens during the acute phase of infection. The lowest concentration of SARS-CoV-2 viral copies this assay can detect is 138 copies/mL. A negative result does not preclude SARS-Cov-2 infection and should not be used as the sole basis for treatment or other patient management decisions. A negative result may occur with  improper specimen collection/handling, submission of specimen other than nasopharyngeal swab, presence of viral mutation(s) within the areas targeted by this assay, and inadequate number of viral copies(<138 copies/mL). A negative result must be combined with clinical observations, patient history, and epidemiological information. The expected result is Negative.  Fact Sheet for Patients:  EntrepreneurPulse.com.au  Fact Sheet for Healthcare Providers:  IncredibleEmployment.be  This test is no t yet approved or cleared by the [place] FDA and  has been authorized for detection and/or diagnosis of SARS-CoV-2 by FDA under an Emergency Use Authorization (EUA). This EUA will remain  in effect (meaning this test can be used) for the duration of the COVID-19 declaration under Section 564(b)(1) of the Act, 21 U.S.C.section 360bbb-3(b)(1), unless the authorization is terminated  or revoked sooner.       Influenza A by PCR NEGATIVE NEGATIVE Final   Influenza B by PCR NEGATIVE NEGATIVE Final    Comment: (NOTE) The Xpert Xpress SARS-CoV-2/FLU/RSV plus assay  is intended as an aid in the diagnosis of influenza from Nasopharyngeal swab specimens and should not be used as a sole basis for treatment. Nasal washings and aspirates are unacceptable for Xpert Xpress SARS-CoV-2/FLU/RSV testing.  Fact Sheet for Patients: EntrepreneurPulse.com.au  Fact Sheet for Healthcare Providers: IncredibleEmployment.be  This test is not yet approved or cleared by the [place] FDA and has been authorized for detection and/or diagnosis of SARS-CoV-2 by FDA under an Emergency Use Authorization (EUA). This EUA will remain in effect (meaning this test can be used) for the duration of the COVID-19 declaration under Section 564(b)(1) of the Act, 21 U.S.C. section 360bbb-3(b)(1), unless the authorization is terminated or revoked.  Performed at [facility], [address], [place], [place] [address]      Discharge Instructions:   Discharge Instructions    Diet - low sodium heart healthy   Complete by: As directed    Increase activity slowly   Complete by: As directed      Allergies as of 09/05/2020      Reactions   Aspirin Other (See Comments)   Gi upset   Other Other (See Comments)   GI upset      Medication List    STOP taking these medications   alendronate 10 MG tablet Commonly known as: FOSAMAX   ergocalciferol 1.25 MG (50000 UT) capsule Commonly known as: VITAMIN D2   ranitidine 150  MG tablet Commonly known as: ZANTAC   sucralfate 1 g tablet Commonly known as: CARAFATE     TAKE these medications   acetaminophen 325 MG tablet Commonly known as: Tylenol Take 2 tablets (650 mg total) by mouth every 4 (four) hours as needed for up to 3 days.   atorvastatin 20 MG tablet Commonly known as: LIPITOR Take 30 mg by mouth daily.   Calcium Carb-Cholecalciferol 600-400 MG-UNIT Tabs Take 1 tablet by mouth in the morning and at bedtime.   famotidine 40 MG tablet Commonly known as:  PEPCID Take 40 mg by mouth 2 (two) times daily.   fluticasone 50 MCG/ACT nasal spray Commonly known as: FLONASE Place 2 sprays into both nostrils daily.       Follow-up Information    Tylene Fantasia, PA-C. Schedule an appointment as soon as possible for a visit in 2 week(s).   Specialty: Physician Assistant Why: s/p lap chole Contact information: [address] [place] [address] [phone]                Time coordinating discharge: 29 minutes  Signed:  Jennye Boroughs  [facility] 09/05/2020, 9:17 AM   Pager on [email or web address]. If 7PM-7AM, please contact night-coverage at [email or web address]

## 2020-09-05 NOTE — Progress Notes (Signed)
09/05/2020  Subjective: Patient is 1 Day Post-Op status post robotic cholecystectomy with Dr. Christian Mate yesterday.  No acute events overnight.  Patient reports that she is doing very well and tolerating her diet with no nausea and only with minimal pain at the incisions.  Vital signs: Temp:  [97.3 F (36.3 C)-98.6 F (37 C)] 98.6 F (37 C) (05/14 0418) Pulse Rate:  [62-82] 74 (05/14 0418) Resp:  [9-18] 18 (05/14 0418) BP: (111-164)/(55-72) 127/60 (05/14 0418) SpO2:  [95 %-100 %] 96 % (05/14 0418)   Intake/Output: 05/13 0701 - 05/14 0700 In: 1045 [P.O.:240; I.V.:805] Out: 5 [Blood:5] Last BM Date: 09/03/20  Physical Exam: Constitutional: No acute distress Abdomen: Soft, nondistended, appropriately tender to palpation.  All 4 incisions are clean, dry, intact and healing well.  No evidence of infection  Labs:  No results for input(s): WBC, HGB, HCT, PLT in the last 72 hours. Recent Labs    09/03/20 0308  NA 138  K 3.6  CL 104  CO2 24  GLUCOSE 88  BUN 12  CREATININE 0.93  CALCIUM 8.9   No results for input(s): LABPROT, INR in the last 72 hours.  Imaging: No results found.  Assessment/Plan: This is a 70 y.o. female s/p robotic assisted cholecystectomy.  - Okay to continue advancing the diet today and discharge the patient later on as she tolerates her food. - Prescriptions for pain medications ordered. - Follow-up with Mr. Olean Ree in 2 weeks.   Melvyn Neth, [facility]

## 2020-09-05 NOTE — Plan of Care (Signed)
Continuing with plan of care. 

## 2020-09-05 NOTE — Anesthesia Postprocedure Evaluation (Signed)
Anesthesia Post Note  Patient: Candice Leblanc  Procedure(s) Performed: ENDOSCOPIC RETROGRADE CHOLANGIOPANCREATOGRAPHY (ERCP) (N/A )  Patient location during evaluation: PACU Anesthesia Type: General Level of consciousness: awake and alert Pain management: pain level controlled Vital Signs Assessment: post-procedure vital signs reviewed and stable Respiratory status: spontaneous breathing, nonlabored ventilation, respiratory function stable and patient connected to nasal cannula oxygen Cardiovascular status: blood pressure returned to baseline and stable Postop Assessment: no apparent nausea or vomiting Anesthetic complications: no   No complications documented.   Last Vitals:  Vitals:   09/04/20 2311 09/05/20 0418  BP: (!) 111/55 127/60  Pulse: 74 74  Resp: 18 18  Temp: 37 C 37 C  SpO2: 100% 96%    Last Pain:  Vitals:   09/05/20 0604  TempSrc:   PainSc: Asleep                 Molli Barrows

## 2020-09-08 LAB — SURGICAL PATHOLOGY

## 2020-09-22 ENCOUNTER — Encounter: Payer: Self-pay | Admitting: Physician Assistant

## 2020-09-22 ENCOUNTER — Ambulatory Visit (INDEPENDENT_AMBULATORY_CARE_PROVIDER_SITE_OTHER): Payer: Medicare HMO | Admitting: Physician Assistant

## 2020-09-22 ENCOUNTER — Other Ambulatory Visit: Payer: Self-pay

## 2020-09-22 VITALS — BP 133/71 | HR 89 | Temp 98.5°F | Ht 65.0 in | Wt 152.0 lb

## 2020-09-22 DIAGNOSIS — E7800 Pure hypercholesterolemia, unspecified: Secondary | ICD-10-CM | POA: Insufficient documentation

## 2020-09-22 DIAGNOSIS — K219 Gastro-esophageal reflux disease without esophagitis: Secondary | ICD-10-CM | POA: Insufficient documentation

## 2020-09-22 DIAGNOSIS — Z09 Encounter for follow-up examination after completed treatment for conditions other than malignant neoplasm: Secondary | ICD-10-CM

## 2020-09-22 DIAGNOSIS — E559 Vitamin D deficiency, unspecified: Secondary | ICD-10-CM | POA: Insufficient documentation

## 2020-09-22 DIAGNOSIS — K8050 Calculus of bile duct without cholangitis or cholecystitis without obstruction: Secondary | ICD-10-CM

## 2020-09-22 NOTE — Patient Instructions (Signed)

## 2020-09-22 NOTE — Progress Notes (Signed)
[facility] POST-OP OFFICE VISIT  09/22/2020  HPI: Candice Leblanc is a 70 y.o. female 18 days s/p robotic assisted laparoscopic cholecystectomy for choledocholithiasis and CCC with Dr Christian Mate  She has done exceptionally well No complaints of abdominal pain, nausea, emesis, diarrhea She is tolerating PO without issues No problems with her incisions No other complaints  Vital signs: BP 133/71   Pulse 89   Temp 98.5 F (36.9 C) (Oral)   Ht 5\' 5"  (1.651 m)   Wt 152 lb (68.9 kg)   SpO2 97%   BMI 25.29 kg/m    Physical Exam: Constitutional: Well appearing female, NAD Abdomen: Soft, non-tender, non-distended, no rebound/guarding Skin: Laparoscopic incisions are well healed, no erythema or drainage  Assessment/Plan: This is a 70 y.o. female 18 days s/p robotic assisted laparoscopic cholecystectomy for choledocholithiasis and CCC   - Pain control prn with OTC medications  - Reviewed wound care  - Reviewed lifting restrictions; 4 weeks total  - Reviewed Pathology: CCC, negative for malignancy  - She will return to clinic on as needed basis   -- Edison Simon, PA-C [facility] 09/22/2020, 2:33 PM [phone] M-F: 7am - 4pm

## 2020-09-24 ENCOUNTER — Telehealth: Payer: Self-pay

## 2020-09-24 NOTE — Telephone Encounter (Signed)
-----   Message from Lucilla Lame, MD sent at 09/07/2020 12:02 PM EDT ----- Have this patient come in to speak with me since the polyp in the small intestines was precancerous and she will need to have it removed.

## 2020-09-24 NOTE — Telephone Encounter (Signed)
Pt contacted and scheduled for a follow up appt on June 7th.

## 2020-09-29 ENCOUNTER — Other Ambulatory Visit: Payer: Self-pay

## 2020-09-29 ENCOUNTER — Ambulatory Visit (INDEPENDENT_AMBULATORY_CARE_PROVIDER_SITE_OTHER): Payer: Medicare HMO | Admitting: Gastroenterology

## 2020-09-29 ENCOUNTER — Encounter: Payer: Self-pay | Admitting: Gastroenterology

## 2020-09-29 VITALS — BP 127/67 | HR 83 | Ht 65.0 in | Wt 154.0 lb

## 2020-09-29 DIAGNOSIS — D132 Benign neoplasm of duodenum: Secondary | ICD-10-CM

## 2020-09-29 DIAGNOSIS — K317 Polyp of stomach and duodenum: Secondary | ICD-10-CM

## 2020-09-29 NOTE — Progress Notes (Signed)
Primary Care Physician: Dion Body, MD  Primary Gastroenterologist:  Dr. Lucilla Lame  Chief Complaint  Patient presents with  . Follow-up    ERCP on 09/03/20    HPI: Candice Leblanc is a 69 y.o. female here  for follow-up after having an ERCP with a stone extraction.  The patient was found to have a polyp in the duodenum that was biopsied and was shown to be a adenoma.  Past Medical History:  Diagnosis Date  . Breast cancer ([place]) 2006   LT MASTECTOMY  . Diabetes mellitus without complication (HCC)    borderline  . GERD (gastroesophageal reflux disease)   . Hyperlipidemia   . S/P chemotherapy, time since greater than 12 weeks 2006   BREAST CA  . Vitamin D deficiency     Current Outpatient Medications  Medication Sig Dispense Refill  . acetaminophen (TYLENOL) 500 MG tablet Take 2 tablets (1,000 mg total) by mouth every 6 (six) hours as needed for mild pain.    Marland Kitchen atorvastatin (LIPITOR) 20 MG tablet Take 30 mg by mouth daily.    . Calcium Carb-Cholecalciferol 600-400 MG-UNIT TABS Take 1 tablet by mouth in the morning and at bedtime.    . famotidine (PEPCID) 40 MG tablet Take 40 mg by mouth 2 (two) times daily.    . fluticasone (FLONASE) 50 MCG/ACT nasal spray Place 2 sprays into both nostrils daily.    Marland Kitchen ibuprofen (ADVIL) 600 MG tablet Take 1 tablet (600 mg total) by mouth every 8 (eight) hours as needed for moderate pain. 30 tablet 1   No current facility-administered medications for this visit.    Allergies as of 09/29/2020 - Review Complete 09/29/2020  Allergen Reaction Noted  . Aspirin Other (See Comments) 04/10/2015  . Other Other (See Comments) 06/09/2014    ROS:  General: Negative for anorexia, weight loss, fever, chills, fatigue, weakness. ENT: Negative for hoarseness, difficulty swallowing , nasal congestion. CV: Negative for chest pain, angina, palpitations, dyspnea on exertion, peripheral edema.  Respiratory: Negative for dyspnea at rest,  dyspnea on exertion, cough, sputum, wheezing.  GI: See history of present illness. GU:  Negative for dysuria, hematuria, urinary incontinence, urinary frequency, nocturnal urination.  Endo: Negative for unusual weight change.    Physical Examination:   BP 127/67   Pulse 83   Ht 5\' 5"  (1.651 m)   Wt 154 lb (69.9 kg)   BMI 25.63 kg/m   General: Well-nourished, well-developed in no acute distress.  Eyes: No icterus. Conjunctivae pink. Neuro: Alert and oriented x 3.  Grossly intact. Skin: Warm and dry, no jaundice.   Psych: Alert and cooperative, normal mood and affect.  Labs:    Imaging Studies: CT ABDOMEN PELVIS WO CONTRAST  Result Date: 09/01/2020 CLINICAL DATA:  Epigastric pain since last night. Patient denies nausea, vomiting and diarrhea. Elevated liver function studies. Biliary obstruction suspected. EXAM: CT ABDOMEN AND PELVIS WITHOUT CONTRAST TECHNIQUE: Multidetector CT imaging of the abdomen and pelvis was performed following the standard protocol without IV contrast. COMPARISON:  None. FINDINGS: Lower chest: Mild linear atelectasis or scarring in the right lower lobe. The lung bases are otherwise clear. Trace pericardial fluid with mild coronary and aortic atherosclerosis. Hepatobiliary: No focal hepatic abnormalities are identified on noncontrast imaging. The gallbladder is incompletely distended. No evidence of gallstones, gallbladder wall thickening or pericholecystic fluid. There is mild intra and extrahepatic biliary dilatation with the common hepatic duct measuring 9 mm in diameter. No calcified intraductal calculi are visualized. Pancreas:  No evidence of pancreatic head or ampullary mass. There is no pancreatic ductal dilatation, surrounding inflammation or fluid collection. Spleen: Normal in size without focal abnormality. Adrenals/Urinary Tract: Both adrenal glands appear normal. There are 2 prominent water density cysts in the interpolar region of the left kidney measuring  up to 4.1 cm in diameter. Posteriorly in the mid left kidney, there is a subcentimeter hyperdense lesion which is likely a small hemorrhagic cysts. No evidence of urinary tract calculus or hydronephrosis. The bladder appears normal. Stomach/Bowel: No enteric contrast administered. The stomach appears unremarkable for its degree of distension. No evidence of bowel wall thickening, distention or surrounding inflammatory change. The appendix appears normal. Vascular/Lymphatic: There are no enlarged abdominal or pelvic lymph nodes. Aortic and branch vessel atherosclerosis without acute vascular findings on noncontrast imaging. Reproductive: There is a 5.6 x 4.5 cm left adnexal mass on image 60/2. This appears homogeneous in density and abuts the uterus, possibly reflecting an exophytic fibroid. The uterus otherwise appears normal. No right adnexal mass or pelvic inflammatory changes. Other: No evidence of abdominal wall mass or hernia. No ascites. Musculoskeletal: No acute or significant osseous findings. Lumbar spine facet arthropathy. IMPRESSION: 1. Mild intrahepatic and extrahepatic biliary dilatation of undetermined etiology. No calcified stones are identified. Consider right upper quadrant abdominal ultrasound or MRCP for further evaluation. 2. No evidence of pancreatic or ampullary lesion. No pancreatic ductal dilatation. 3. Indeterminate left adnexal lesion, potentially an exophytic fibroid. This is incompletely evaluated by this noncontrast study, and further evaluation with pelvic ultrasound recommended. 4.  Aortic Atherosclerosis (ICD10-I70.0). Electronically Signed   By: Richardean Sale M.D.   On: 09/01/2020 15:22   MR 3D Recon At Scanner  Result Date: 09/02/2020 CLINICAL DATA:  70 year old female with acute onset epigastric pain and burning. Gallstone noted on ultrasound. EXAM: MRI ABDOMEN WITHOUT AND WITH CONTRAST (INCLUDING MRCP) TECHNIQUE: Multiplanar multisequence MR imaging of the abdomen was  performed both before and after the administration of intravenous contrast. Heavily T2-weighted images of the biliary and pancreatic ducts were obtained, and three-dimensional MRCP images were rendered by post processing. CONTRAST:  66mL GADAVIST GADOBUTROL 1 MMOL/ML IV SOLN COMPARISON:  CT 09/01/2020 ultrasound 22 FINDINGS: Lower chest:  Lung bases are clear. Hepatobiliary: Nosignificant intrahepatic duct dilatation. The common hepatic duct and common bile ducts are mildly dilated with the common bile duct measuring 9 mm. There are multiple filling defects within the common bile duct over several cm leading up to the ampulla. Filling defects are well seen on coronal image 16/series 3. The gallbladder is contracted around multiple small stones similar to the stones in the common bile duct. Stones measure approximately 2-3 mm each. No focal hepatic lesion. Pancreas: Normal pancreatic parenchymal intensity. No ductal dilatation or inflammation. Spleen: Normal spleen. Adrenals/urinary tract: Adrenal glands normal. Benign-appearing cyst of the RIGHT kidney. Stomach/Bowel: Stomach and limited of the small bowel is unremarkable Vascular/Lymphatic: Abdominal aortic normal caliber. No retroperitoneal periportal lymphadenopathy. Musculoskeletal: No aggressive osseous lesion IMPRESSION: 1. Multiple stones within the common bile duct (choledocholithiasis). Minimal intrahepatic duct dilatation. 2. Gallbladder contracted around multiple gallstones. 3. No evidence of pancreatitis. Electronically Signed   By: Suzy Bouchard M.D.   On: 09/02/2020 07:45   DG C-Arm 1-60 Min-No Report  Result Date: 09/03/2020 Fluoroscopy was utilized by the requesting physician.  No radiographic interpretation.   MR ABDOMEN MRCP W WO CONTAST  Result Date: 09/02/2020 CLINICAL DATA:  70 year old female with acute onset epigastric pain and burning. Gallstone noted on ultrasound. EXAM: MRI  ABDOMEN WITHOUT AND WITH CONTRAST (INCLUDING MRCP)  TECHNIQUE: Multiplanar multisequence MR imaging of the abdomen was performed both before and after the administration of intravenous contrast. Heavily T2-weighted images of the biliary and pancreatic ducts were obtained, and three-dimensional MRCP images were rendered by post processing. CONTRAST:  77mL GADAVIST GADOBUTROL 1 MMOL/ML IV SOLN COMPARISON:  CT 09/01/2020 ultrasound 22 FINDINGS: Lower chest:  Lung bases are clear. Hepatobiliary: Nosignificant intrahepatic duct dilatation. The common hepatic duct and common bile ducts are mildly dilated with the common bile duct measuring 9 mm. There are multiple filling defects within the common bile duct over several cm leading up to the ampulla. Filling defects are well seen on coronal image 16/series 3. The gallbladder is contracted around multiple small stones similar to the stones in the common bile duct. Stones measure approximately 2-3 mm each. No focal hepatic lesion. Pancreas: Normal pancreatic parenchymal intensity. No ductal dilatation or inflammation. Spleen: Normal spleen. Adrenals/urinary tract: Adrenal glands normal. Benign-appearing cyst of the RIGHT kidney. Stomach/Bowel: Stomach and limited of the small bowel is unremarkable Vascular/Lymphatic: Abdominal aortic normal caliber. No retroperitoneal periportal lymphadenopathy. Musculoskeletal: No aggressive osseous lesion IMPRESSION: 1. Multiple stones within the common bile duct (choledocholithiasis). Minimal intrahepatic duct dilatation. 2. Gallbladder contracted around multiple gallstones. 3. No evidence of pancreatitis. Electronically Signed   By: Suzy Bouchard M.D.   On: 09/02/2020 07:45   US Abdomen Limited RUQ (LIVER/GB)  Result Date: 09/01/2020 CLINICAL DATA:  Elevated bilirubin EXAM: ULTRASOUND ABDOMEN LIMITED RIGHT UPPER QUADRANT COMPARISON:  None. FINDINGS: Gallbladder: Gallbladder is decompressed with multiple stones within. No wall thickening or pericholecystic fluid is noted. Negative  sonographic Murphy's sign is elicited. Common bile duct: Diameter: 7 mm Liver: No focal lesion identified. Within normal limits in parenchymal echogenicity. Portal vein is patent on color Doppler imaging with normal direction of blood flow towards the liver. Other: None. IMPRESSION: Multiple gallstones in a decompressed gallbladder. No other focal abnormality is noted. Electronically Signed   By: Inez Catalina M.D.   On: 09/01/2020 16:41    Assessment and Plan:   Candice Leblanc is a 70 y.o. y/o female who comes in today with a history of having an ERCP with stone extraction.  A timely year screening the patient had a polyp in the small bowel that was found to be an adenoma.  The patient will be set up for an EGD for removal of this polyp.  The patient has been explained the plan and agrees with it.     Lucilla Lame, MD. Marval Regal    Note: This dictation was prepared with Dragon dictation along with smaller phrase technology. Any transcriptional errors that result from this process are unintentional.

## 2020-09-29 NOTE — H&P (View-Only) (Signed)
Primary Care Physician: Dion Body, MD  Primary Gastroenterologist:  Dr. Lucilla Lame  Chief Complaint  Patient presents with  . Follow-up    ERCP on 09/03/20    HPI: Candice Leblanc is a 70 y.o. female here  for follow-up after having an ERCP with a stone extraction.  The patient was found to have a polyp in the duodenum that was biopsied and was shown to be a adenoma.  Past Medical History:  Diagnosis Date  . Breast cancer ([place]) 2006   LT MASTECTOMY  . Diabetes mellitus without complication (HCC)    borderline  . GERD (gastroesophageal reflux disease)   . Hyperlipidemia   . S/P chemotherapy, time since greater than 12 weeks 2006   BREAST CA  . Vitamin D deficiency     Current Outpatient Medications  Medication Sig Dispense Refill  . acetaminophen (TYLENOL) 500 MG tablet Take 2 tablets (1,000 mg total) by mouth every 6 (six) hours as needed for mild pain.    Marland Kitchen atorvastatin (LIPITOR) 20 MG tablet Take 30 mg by mouth daily.    . Calcium Carb-Cholecalciferol 600-400 MG-UNIT TABS Take 1 tablet by mouth in the morning and at bedtime.    . famotidine (PEPCID) 40 MG tablet Take 40 mg by mouth 2 (two) times daily.    . fluticasone (FLONASE) 50 MCG/ACT nasal spray Place 2 sprays into both nostrils daily.    Marland Kitchen ibuprofen (ADVIL) 600 MG tablet Take 1 tablet (600 mg total) by mouth every 8 (eight) hours as needed for moderate pain. 30 tablet 1   No current facility-administered medications for this visit.    Allergies as of 09/29/2020 - Review Complete 09/29/2020  Allergen Reaction Noted  . Aspirin Other (See Comments) 04/10/2015  . Other Other (See Comments) 06/09/2014    ROS:  General: Negative for anorexia, weight loss, fever, chills, fatigue, weakness. ENT: Negative for hoarseness, difficulty swallowing , nasal congestion. CV: Negative for chest pain, angina, palpitations, dyspnea on exertion, peripheral edema.  Respiratory: Negative for dyspnea at rest,  dyspnea on exertion, cough, sputum, wheezing.  GI: See history of present illness. GU:  Negative for dysuria, hematuria, urinary incontinence, urinary frequency, nocturnal urination.  Endo: Negative for unusual weight change.    Physical Examination:   BP 127/67   Pulse 83   Ht 5\' 5"  (1.651 m)   Wt 154 lb (69.9 kg)   BMI 25.63 kg/m   General: Well-nourished, well-developed in no acute distress.  Eyes: No icterus. Conjunctivae pink. Neuro: Alert and oriented x 3.  Grossly intact. Skin: Warm and dry, no jaundice.   Psych: Alert and cooperative, normal mood and affect.  Labs:    Imaging Studies: CT ABDOMEN PELVIS WO CONTRAST  Result Date: 09/01/2020 CLINICAL DATA:  Epigastric pain since last night. Patient denies nausea, vomiting and diarrhea. Elevated liver function studies. Biliary obstruction suspected. EXAM: CT ABDOMEN AND PELVIS WITHOUT CONTRAST TECHNIQUE: Multidetector CT imaging of the abdomen and pelvis was performed following the standard protocol without IV contrast. COMPARISON:  None. FINDINGS: Lower chest: Mild linear atelectasis or scarring in the right lower lobe. The lung bases are otherwise clear. Trace pericardial fluid with mild coronary and aortic atherosclerosis. Hepatobiliary: No focal hepatic abnormalities are identified on noncontrast imaging. The gallbladder is incompletely distended. No evidence of gallstones, gallbladder wall thickening or pericholecystic fluid. There is mild intra and extrahepatic biliary dilatation with the common hepatic duct measuring 9 mm in diameter. No calcified intraductal calculi are visualized. Pancreas:  No evidence of pancreatic head or ampullary mass. There is no pancreatic ductal dilatation, surrounding inflammation or fluid collection. Spleen: Normal in size without focal abnormality. Adrenals/Urinary Tract: Both adrenal glands appear normal. There are 2 prominent water density cysts in the interpolar region of the left kidney measuring  up to 4.1 cm in diameter. Posteriorly in the mid left kidney, there is a subcentimeter hyperdense lesion which is likely a small hemorrhagic cysts. No evidence of urinary tract calculus or hydronephrosis. The bladder appears normal. Stomach/Bowel: No enteric contrast administered. The stomach appears unremarkable for its degree of distension. No evidence of bowel wall thickening, distention or surrounding inflammatory change. The appendix appears normal. Vascular/Lymphatic: There are no enlarged abdominal or pelvic lymph nodes. Aortic and branch vessel atherosclerosis without acute vascular findings on noncontrast imaging. Reproductive: There is a 5.6 x 4.5 cm left adnexal mass on image 60/2. This appears homogeneous in density and abuts the uterus, possibly reflecting an exophytic fibroid. The uterus otherwise appears normal. No right adnexal mass or pelvic inflammatory changes. Other: No evidence of abdominal wall mass or hernia. No ascites. Musculoskeletal: No acute or significant osseous findings. Lumbar spine facet arthropathy. IMPRESSION: 1. Mild intrahepatic and extrahepatic biliary dilatation of undetermined etiology. No calcified stones are identified. Consider right upper quadrant abdominal ultrasound or MRCP for further evaluation. 2. No evidence of pancreatic or ampullary lesion. No pancreatic ductal dilatation. 3. Indeterminate left adnexal lesion, potentially an exophytic fibroid. This is incompletely evaluated by this noncontrast study, and further evaluation with pelvic ultrasound recommended. 4.  Aortic Atherosclerosis (ICD10-I70.0). Electronically Signed   By: Richardean Sale M.D.   On: 09/01/2020 15:22   MR 3D Recon At Scanner  Result Date: 09/02/2020 CLINICAL DATA:  70 year old female with acute onset epigastric pain and burning. Gallstone noted on ultrasound. EXAM: MRI ABDOMEN WITHOUT AND WITH CONTRAST (INCLUDING MRCP) TECHNIQUE: Multiplanar multisequence MR imaging of the abdomen was  performed both before and after the administration of intravenous contrast. Heavily T2-weighted images of the biliary and pancreatic ducts were obtained, and three-dimensional MRCP images were rendered by post processing. CONTRAST:  29mL GADAVIST GADOBUTROL 1 MMOL/ML IV SOLN COMPARISON:  CT 09/01/2020 ultrasound 22 FINDINGS: Lower chest:  Lung bases are clear. Hepatobiliary: Nosignificant intrahepatic duct dilatation. The common hepatic duct and common bile ducts are mildly dilated with the common bile duct measuring 9 mm. There are multiple filling defects within the common bile duct over several cm leading up to the ampulla. Filling defects are well seen on coronal image 16/series 3. The gallbladder is contracted around multiple small stones similar to the stones in the common bile duct. Stones measure approximately 2-3 mm each. No focal hepatic lesion. Pancreas: Normal pancreatic parenchymal intensity. No ductal dilatation or inflammation. Spleen: Normal spleen. Adrenals/urinary tract: Adrenal glands normal. Benign-appearing cyst of the RIGHT kidney. Stomach/Bowel: Stomach and limited of the small bowel is unremarkable Vascular/Lymphatic: Abdominal aortic normal caliber. No retroperitoneal periportal lymphadenopathy. Musculoskeletal: No aggressive osseous lesion IMPRESSION: 1. Multiple stones within the common bile duct (choledocholithiasis). Minimal intrahepatic duct dilatation. 2. Gallbladder contracted around multiple gallstones. 3. No evidence of pancreatitis. Electronically Signed   By: Suzy Bouchard M.D.   On: 09/02/2020 07:45   DG C-Arm 1-60 Min-No Report  Result Date: 09/03/2020 Fluoroscopy was utilized by the requesting physician.  No radiographic interpretation.   MR ABDOMEN MRCP W WO CONTAST  Result Date: 09/02/2020 CLINICAL DATA:  70 year old female with acute onset epigastric pain and burning. Gallstone noted on ultrasound. EXAM: MRI  ABDOMEN WITHOUT AND WITH CONTRAST (INCLUDING MRCP)  TECHNIQUE: Multiplanar multisequence MR imaging of the abdomen was performed both before and after the administration of intravenous contrast. Heavily T2-weighted images of the biliary and pancreatic ducts were obtained, and three-dimensional MRCP images were rendered by post processing. CONTRAST:  48mL GADAVIST GADOBUTROL 1 MMOL/ML IV SOLN COMPARISON:  CT 09/01/2020 ultrasound 22 FINDINGS: Lower chest:  Lung bases are clear. Hepatobiliary: Nosignificant intrahepatic duct dilatation. The common hepatic duct and common bile ducts are mildly dilated with the common bile duct measuring 9 mm. There are multiple filling defects within the common bile duct over several cm leading up to the ampulla. Filling defects are well seen on coronal image 16/series 3. The gallbladder is contracted around multiple small stones similar to the stones in the common bile duct. Stones measure approximately 2-3 mm each. No focal hepatic lesion. Pancreas: Normal pancreatic parenchymal intensity. No ductal dilatation or inflammation. Spleen: Normal spleen. Adrenals/urinary tract: Adrenal glands normal. Benign-appearing cyst of the RIGHT kidney. Stomach/Bowel: Stomach and limited of the small bowel is unremarkable Vascular/Lymphatic: Abdominal aortic normal caliber. No retroperitoneal periportal lymphadenopathy. Musculoskeletal: No aggressive osseous lesion IMPRESSION: 1. Multiple stones within the common bile duct (choledocholithiasis). Minimal intrahepatic duct dilatation. 2. Gallbladder contracted around multiple gallstones. 3. No evidence of pancreatitis. Electronically Signed   By: Suzy Bouchard M.D.   On: 09/02/2020 07:45   US Abdomen Limited RUQ (LIVER/GB)  Result Date: 09/01/2020 CLINICAL DATA:  Elevated bilirubin EXAM: ULTRASOUND ABDOMEN LIMITED RIGHT UPPER QUADRANT COMPARISON:  None. FINDINGS: Gallbladder: Gallbladder is decompressed with multiple stones within. No wall thickening or pericholecystic fluid is noted. Negative  sonographic Murphy's sign is elicited. Common bile duct: Diameter: 7 mm Liver: No focal lesion identified. Within normal limits in parenchymal echogenicity. Portal vein is patent on color Doppler imaging with normal direction of blood flow towards the liver. Other: None. IMPRESSION: Multiple gallstones in a decompressed gallbladder. No other focal abnormality is noted. Electronically Signed   By: Inez Catalina M.D.   On: 09/01/2020 16:41    Assessment and Plan:   Candice Leblanc is a 70 y.o. y/o female who comes in today with a history of having an ERCP with stone extraction.  A timely year screening the patient had a polyp in the small bowel that was found to be an adenoma.  The patient will be set up for an EGD for removal of this polyp.  The patient has been explained the plan and agrees with it.     Lucilla Lame, MD. [facility]al    Note: This dictation was prepared with Dragon dictation along with smaller phrase technology. Any transcriptional errors that result from this process are unintentional.

## 2020-10-13 ENCOUNTER — Other Ambulatory Visit: Payer: Self-pay

## 2020-10-13 ENCOUNTER — Ambulatory Visit: Payer: Medicare HMO | Admitting: Pediatrics

## 2020-10-13 ENCOUNTER — Ambulatory Visit
Admission: RE | Admit: 2020-10-13 | Discharge: 2020-10-13 | Disposition: A | Discharge status: Home / Self Care | Payer: Medicare HMO | Attending: Gastroenterology | Admitting: Gastroenterology

## 2020-10-13 ENCOUNTER — Encounter: Payer: Self-pay | Admitting: Gastroenterology

## 2020-10-13 ENCOUNTER — Encounter
Admission: RE | Disposition: A | Discharge status: Home / Self Care | Payer: Self-pay | Source: Home / Self Care | Attending: Gastroenterology

## 2020-10-13 DIAGNOSIS — Z886 Allergy status to analgesic agent status: Secondary | ICD-10-CM | POA: Insufficient documentation

## 2020-10-13 DIAGNOSIS — Z79899 Other long term (current) drug therapy: Secondary | ICD-10-CM | POA: Insufficient documentation

## 2020-10-13 DIAGNOSIS — D132 Benign neoplasm of duodenum: Secondary | ICD-10-CM | POA: Diagnosis not present

## 2020-10-13 DIAGNOSIS — Z9221 Personal history of antineoplastic chemotherapy: Secondary | ICD-10-CM | POA: Insufficient documentation

## 2020-10-13 DIAGNOSIS — Z853 Personal history of malignant neoplasm of breast: Secondary | ICD-10-CM | POA: Diagnosis not present

## 2020-10-13 DIAGNOSIS — K317 Polyp of stomach and duodenum: Secondary | ICD-10-CM | POA: Diagnosis not present

## 2020-10-13 DIAGNOSIS — Z9012 Acquired absence of left breast and nipple: Secondary | ICD-10-CM | POA: Insufficient documentation

## 2020-10-13 DIAGNOSIS — D126 Benign neoplasm of colon, unspecified: Secondary | ICD-10-CM | POA: Diagnosis not present

## 2020-10-13 HISTORY — PX: ESOPHAGOGASTRODUODENOSCOPY (EGD) WITH PROPOFOL: SHX5813

## 2020-10-13 SURGERY — ESOPHAGOGASTRODUODENOSCOPY (EGD) WITH PROPOFOL
Anesthesia: General

## 2020-10-13 MED ORDER — PROPOFOL 500 MG/50ML IV EMUL
INTRAVENOUS | Status: DC | PRN
  Administered 2020-10-13: 200 ug/kg/min via INTRAVENOUS

## 2020-10-13 MED ORDER — SODIUM CHLORIDE 0.9 % IV SOLN: INTRAVENOUS | Status: DC | PRN

## 2020-10-13 MED ORDER — PROPOFOL 10 MG/ML IV BOLUS
INTRAVENOUS | Status: DC | PRN
  Administered 2020-10-13: 50 mg via INTRAVENOUS

## 2020-10-13 MED ORDER — LIDOCAINE HCL (PF) 1 % IJ SOLN
INTRAMUSCULAR | Status: AC
  Filled 2020-10-13: qty 2

## 2020-10-13 MED ORDER — SODIUM CHLORIDE 0.9 % IV SOLN: INTRAVENOUS | Status: DC

## 2020-10-13 MED ORDER — LIDOCAINE HCL (PF) 2 % IJ SOLN
INTRAMUSCULAR | Status: DC | PRN
  Administered 2020-10-13: 100 mg via INTRADERMAL

## 2020-10-13 NOTE — Anesthesia Postprocedure Evaluation (Signed)
Anesthesia Post Note  Patient: Candice Leblanc  Procedure(s) Performed: ESOPHAGOGASTRODUODENOSCOPY (EGD) WITH PROPOFOL  Patient location during evaluation: Endoscopy Anesthesia Type: General Level of consciousness: awake and alert Pain management: pain level controlled Vital Signs Assessment: post-procedure vital signs reviewed and stable Respiratory status: spontaneous breathing, nonlabored ventilation, respiratory function stable and patient connected to nasal cannula oxygen Cardiovascular status: blood pressure returned to baseline and stable Postop Assessment: no apparent nausea or vomiting Anesthetic complications: no   No notable events documented.   Last Vitals:  Vitals:   10/13/20 1228 10/13/20 1238  BP: 136/73   Pulse: 86   Resp: 16 16  Temp: 36.8 C   SpO2: 100%     Last Pain:  Vitals:   10/13/20 1238  TempSrc:   PainSc: 0-No pain                 Larey Dresser

## 2020-10-13 NOTE — Transfer of Care (Signed)
Immediate Anesthesia Transfer of Care Note  Patient: Candice Leblanc  Procedure(s) Performed: ESOPHAGOGASTRODUODENOSCOPY (EGD) WITH PROPOFOL  Patient Location: PACU  Anesthesia Type:General  Level of Consciousness: awake, alert  and oriented  Airway & Oxygen Therapy: Patient Spontanous Breathing and Patient connected to nasal cannula oxygen  Post-op Assessment: Report given to RN and Post -op Vital signs reviewed and stable  Post vital signs: Reviewed and stable  Last Vitals:  Vitals Value Taken Time  BP    Temp    Pulse    Resp    SpO2      Last Pain:  Vitals:   10/13/20 0956  TempSrc: Temporal  PainSc: 0-No pain         Complications: No notable events documented.

## 2020-10-13 NOTE — Interval H&P Note (Signed)
Lucilla Lame, MD [address] [place], [place] [address] Phone:[phone] Fax : [phone]  Primary Care Physician:  Dion Body, MD Primary Gastroenterologist:  Dr. Allen Norris  Pre-Procedure History & Physical: HPI:  Candice Leblanc is a 70 y.o. female is here for an endoscopy.   Past Medical History:  Diagnosis Date   Breast cancer ([place]) 2006   LT MASTECTOMY   Diabetes mellitus without complication ([place])    borderline   GERD (gastroesophageal reflux disease)    Hyperlipidemia    S/P chemotherapy, time since greater than 12 weeks 2006   BREAST CA   Vitamin D deficiency     Past Surgical History:  Procedure Laterality Date   BREAST EXCISIONAL BIOPSY Right 2000?   EXCISIONAL - NEG   COLONOSCOPY WITH PROPOFOL N/A 04/13/2015   Procedure: COLONOSCOPY WITH PROPOFOL;  Surgeon: Hulen Luster, MD;  Location: [facility] ENDOSCOPY;  Service: Gastroenterology;  Laterality: N/A;   ERCP N/A 09/03/2020   Procedure: ENDOSCOPIC RETROGRADE CHOLANGIOPANCREATOGRAPHY (ERCP);  Surgeon: Lucilla Lame, MD;  Location: [facility] ENDOSCOPY;  Service: Endoscopy;  Laterality: N/A;   MASTECTOMY Left 2006   breast ca    Prior to Admission medications   Medication Sig Start Date End Date Taking? Authorizing Provider  atorvastatin (LIPITOR) 20 MG tablet Take 30 mg by mouth daily.   Yes [provider]  Calcium Carb-Cholecalciferol 600-400 MG-UNIT TABS Take 1 tablet by mouth in the morning and at bedtime.   Yes [provider]  famotidine (PEPCID) 40 MG tablet Take 40 mg by mouth 2 (two) times daily.   Yes [provider]  ibuprofen (ADVIL) 600 MG tablet Take 1 tablet (600 mg total) by mouth every 8 (eight) hours as needed for moderate pain. 09/05/20  Yes Piscoya, Jacqulyn Bath, MD  acetaminophen (TYLENOL) 500 MG tablet Take 2 tablets (1,000 mg total) by mouth every 6 (six) hours as needed for mild pain. 09/05/20   Piscoya, Jacqulyn Bath, MD  fluticasone (FLONASE) 50 MCG/ACT nasal spray  Place 2 sprays into both nostrils daily.    [provider]    Allergies as of 09/30/2020 - Review Complete 09/29/2020  Allergen Reaction Noted   Aspirin Other (See Comments) 04/10/2015   Other Other (See Comments) 06/09/2014    Family History  Problem Relation Age of Onset   Breast cancer Sister 33    Social History   Socioeconomic History   Marital status: Divorced    Spouse name: Not on file   Number of children: Not on file   Years of education: Not on file   Highest education level: Not on file  Occupational History   Not on file  Tobacco Use   Smoking status: Former    Packs/day: 1.00    Years: 10.00    Pack years: 10.00    Types: Cigarettes   Smokeless tobacco: Never  Vaping Use   Vaping Use: Never used  Substance and Sexual Activity   Alcohol use: Never   Drug use: No   Sexual activity: Not on file  Other Topics Concern   Not on file  Social History Narrative   Not on file   Social Determinants of Health   Financial Resource Strain: Not on file  Food Insecurity: Not on file  Transportation Needs: Not on file  Physical Activity: Not on file  Stress: Not on file  Social Connections: Not on file  Intimate Partner Violence: Not on file    Review of Systems: See HPI, otherwise negative  ROS  Physical Exam: BP 132/69   Pulse 85   Temp 97.7 F (36.5 C) (Temporal)   Resp 18   Ht 5\' 6"  (1.676 m)   Wt 69.4 kg   SpO2 98%   BMI 24.69 kg/m  General:   Alert,  pleasant and cooperative in NAD Head:  Normocephalic and atraumatic. Neck:  Supple; no masses or thyromegaly. Lungs:  Clear throughout to auscultation.    Heart:  Regular rate and rhythm. Abdomen:  Soft, nontender and nondistended. Normal bowel sounds, without guarding, and without rebound.   Neurologic:  Alert and  oriented x4;  grossly normal neurologically.  Impression/Plan: Candice Leblanc is here for an endoscopy to be performed for duodenal polyp  Risks, benefits,  limitations, and alternatives regarding  endoscopy have been reviewed with the patient.  Questions have been answered.  All parties agreeable.   Lucilla Lame, MD  10/13/2020, 11:44 AM

## 2020-10-13 NOTE — Anesthesia Preprocedure Evaluation (Addendum)
Anesthesia Evaluation  Patient identified by MRN, date of birth, ID band Patient awake    Reviewed: Allergy & Precautions, H&P , NPO status , Patient's Chart, lab work & pertinent test results, reviewed documented beta blocker date and time   History of Anesthesia Complications Negative for: history of anesthetic complications  Airway Mallampati: II  TM Distance: >3 FB Neck ROM: full    Dental  (+) Edentulous Upper, Upper Dentures,    Pulmonary former smoker,    Pulmonary exam normal breath sounds clear to auscultation       Cardiovascular Exercise Tolerance: Good negative cardio ROS Normal cardiovascular exam Rhythm:Regular Rate:Normal   METs >4; denies CP or SOB w/ exertion   Neuro/Psych negative neurological ROS  negative psych ROS   GI/Hepatic Neg liver ROS, GERD  Medicated, Duodenal polyp   Endo/Other  diabetes (pt denies), Well Controlled, Type 2  Renal/GU negative Renal ROS  negative genitourinary   Musculoskeletal   Abdominal (+) - obese (BMI 24),   Peds  Hematology  Breast CA 2006   Anesthesia Other Findings Past Medical History: 2006: Breast cancer ([place])     Comment:  LT MASTECTOMY No date: Diabetes mellitus without complication (H[facility])     Comment:  borderline No date: GERD (gastroesophageal reflux disease) No date: Hyperlipidemia 2006: S/P chemotherapy, time since greater than 12 weeks     Comment:  BREAST CA No date: Vitamin D deficiency Past Surgical History: 2000?: BREAST EXCISIONAL BIOPSY; Right     Comment:  EXCISIONAL - NEG 04/13/2015: COLONOSCOPY WITH PROPOFOL; N/A     Comment:  Procedure: COLONOSCOPY WITH PROPOFOL;  Surgeon: Hulen Luster, MD;  Location: [facility] ENDOSCOPY;  Service:               Gastroenterology;  Laterality: N/A; 09/03/2020: ERCP; N/A     Comment:  Procedure: ENDOSCOPIC RETROGRADE               CHOLANGIOPANCREATOGRAPHY (ERCP);  Surgeon: Lucilla Lame,                MD;  Location: [facility] ENDOSCOPY;  Service: Endoscopy;                Laterality: N/A; 2006: MASTECTOMY; Left     Comment:  breast ca    Reproductive/Obstetrics negative OB ROS                            Anesthesia Physical  Anesthesia Plan  ASA: 2  Anesthesia Plan: General   Post-op Pain Management:    Induction: Intravenous  PONV Risk Score and Plan: Propofol infusion and TIVA  Airway Management Planned: Natural Airway and Nasal Cannula  Additional Equipment: None  Intra-op Plan:   Post-operative Plan:   Informed Consent: I have reviewed the patients History and Physical, chart, labs and discussed the procedure including the risks, benefits and alternatives for the proposed anesthesia with the patient or authorized representative who has indicated his/her understanding and acceptance.     Dental Advisory Given  Plan Discussed with: Anesthesiologist and CRNA  Anesthesia Plan Comments:         Anesthesia Quick Evaluation

## 2020-10-13 NOTE — Op Note (Addendum)
[facility] Patient Name: Candice Leblanc Procedure Date: 10/13/2020 11:41 AM MRN: [id number] Account #: [id number] Date of Birth: June 11, 1950 Admit Type: Outpatient Age: 70 Room: [facility] ENDO ROOM 4 Gender: Female Note Status: Finalized Procedure:             Upper GI endoscopy Indications:           Polyps in the duodenum Providers:             Lucilla Lame MD, MD Referring MD:          Dion Body (Referring MD) Medicines:             Propofol per Anesthesia Complications:         No immediate complications. Procedure:             Pre-Anesthesia Assessment:                        - Prior to the procedure, a History and Physical was                         performed, and patient medications and allergies were                         reviewed. The patient's tolerance of previous                         anesthesia was also reviewed. The risks and benefits                         of the procedure and the sedation options and risks                         were discussed with the patient. All questions were                         answered, and informed consent was obtained. Prior                         Anticoagulants: The patient has taken no previous                         anticoagulant or antiplatelet agents. ASA Grade                         Assessment: II - A patient with mild systemic disease.                         After reviewing the risks and benefits, the patient                         was deemed in satisfactory condition to undergo the                         procedure.                        After obtaining informed consent, the endoscope was  passed under direct vision. Throughout the procedure,                         the patient's blood pressure, pulse, and oxygen                         saturations were monitored continuously. The Endoscope                         was introduced through the mouth, and  advanced to the                         second part of duodenum. Findings:      The examined esophagus was normal.      The entire examined stomach was normal.      Multiple 2 to 8 mm sessile polyps with no bleeding were found in the       first portion of the duodenum and in the second portion of the duodenum.       Area was successfully injected with 2 mL saline with indigo carmine for       a lift polypectomy. The polyp was removed with a cold snare. Resection       and retrieval were complete. To prevent bleeding post-intervention,       three hemostatic clips were successfully placed (MR conditional). There       was no bleeding at the end of the procedure.      A few 2 mm sessile polyps with no bleeding were found in the second       portion of the duodenum. The polyp was removed with a cold biopsy       forceps. Resection and retrieval were complete. Impression:            - Normal esophagus.                        - Normal stomach.                        - Multiple duodenal polyps. Resected and retrieved.                         Injected. Clips (MR conditional) were placed.                        - A few duodenal polyps. Resected and retrieved. Recommendation:        - Discharge patient to home.                        - Resume previous diet.                        - Continue present medications.                        - Await pathology results. Procedure Code(s):     --- Professional ---                        [phone], Esophagogastroduodenoscopy, flexible,  transoral; with removal of tumor(s), polyp(s), or                         other lesion(s) by snare technique                        43236, 59, Esophagogastroduodenoscopy, flexible,                         transoral; with directed submucosal injection(s), any                         substance                        65465, 59, Esophagogastroduodenoscopy, flexible,                         transoral; with  biopsy, single or multiple Diagnosis Code(s):     --- Professional ---                        K31.7, Polyp of stomach and duodenum CPT copyright 2019 American Medical Association. All rights reserved. The codes documented in this report are preliminary and upon coder review may  be revised to meet current compliance requirements. Lucilla Lame MD, MD 10/13/2020 12:27:57 PM This report has been signed electronically. Number of Addenda: 0 Note Initiated On: 10/13/2020 11:41 AM Estimated Blood Loss:  Estimated blood loss: none.      [facility]

## 2020-10-14 ENCOUNTER — Encounter: Payer: Self-pay | Admitting: Gastroenterology

## 2020-10-14 LAB — SURGICAL PATHOLOGY

## 2020-10-16 DIAGNOSIS — H5203 Hypermetropia, bilateral: Secondary | ICD-10-CM | POA: Diagnosis not present

## 2020-10-20 ENCOUNTER — Telehealth: Payer: Self-pay

## 2020-10-20 NOTE — Telephone Encounter (Signed)
-----   Message from Lucilla Lame, MD sent at 10/19/2020  1:06 PM EDT ----- Please have the patient come in for a follow up.

## 2020-10-20 NOTE — Telephone Encounter (Signed)
Pt scheduled for a follow up appt on 10/27/20.

## 2020-10-27 ENCOUNTER — Encounter: Payer: Self-pay | Admitting: Gastroenterology

## 2020-10-27 ENCOUNTER — Other Ambulatory Visit: Payer: Self-pay

## 2020-10-27 ENCOUNTER — Ambulatory Visit (INDEPENDENT_AMBULATORY_CARE_PROVIDER_SITE_OTHER): Payer: Medicare HMO | Admitting: Gastroenterology

## 2020-10-27 VITALS — BP 132/66 | HR 82 | Ht 65.0 in | Wt 155.2 lb

## 2020-10-27 DIAGNOSIS — K6389 Other specified diseases of intestine: Secondary | ICD-10-CM

## 2020-10-27 NOTE — Progress Notes (Signed)
Primary Care Physician: Dion Body, MD  Primary Gastroenterologist:  Dr. Lucilla Lame  Chief Complaint  Patient presents with  . Follow up EGD results    HPI: Candice Leblanc is a 70 y.o. female here for follow-up after having an EGD with polyps removed were small bowel.  The patient had 5 polyps are removed that were also to be adenomatous.  The patient denies any symptoms of the present time and states that she did very well after the procedure.  Past Medical History:  Diagnosis Date  . Breast cancer ([place]) 2006   LT MASTECTOMY  . Diabetes mellitus without complication (H[facility])    borderline  . GERD (gastroesophageal reflux disease)   . Hyperlipidemia   . S/P chemotherapy, time since greater than 12 weeks 2006   BREAST CA  . Vitamin D deficiency     Current Outpatient Medications  Medication Sig Dispense Refill  . acetaminophen (TYLENOL) 500 MG tablet Take 2 tablets (1,000 mg total) by mouth every 6 (six) hours as needed for mild pain.    Marland Kitchen atorvastatin (LIPITOR) 20 MG tablet Take 30 mg by mouth daily.    . Calcium Carb-Cholecalciferol 600-400 MG-UNIT TABS Take 1 tablet by mouth in the morning and at bedtime.    . famotidine (PEPCID) 40 MG tablet Take 40 mg by mouth 2 (two) times daily.    . fluticasone (FLONASE) 50 MCG/ACT nasal spray Place 2 sprays into both nostrils daily.    Marland Kitchen ibuprofen (ADVIL) 600 MG tablet Take 1 tablet (600 mg total) by mouth every 8 (eight) hours as needed for moderate pain. 30 tablet 1   No current facility-administered medications for this visit.    Allergies as of 10/27/2020 - Review Complete 10/27/2020  Allergen Reaction Noted  . Aspirin Other (See Comments) 04/10/2015  . Other Other (See Comments) 06/09/2014    ROS:  General: Negative for anorexia, weight loss, fever, chills, fatigue, weakness. ENT: Negative for hoarseness, difficulty swallowing , nasal congestion. CV: Negative for chest pain, angina, palpitations, dyspnea  on exertion, peripheral edema.  Respiratory: Negative for dyspnea at rest, dyspnea on exertion, cough, sputum, wheezing.  GI: See history of present illness. GU:  Negative for dysuria, hematuria, urinary incontinence, urinary frequency, nocturnal urination.  Endo: Negative for unusual weight change.    Physical Examination:   BP 132/66 (BP Location: Right Arm, Patient Position: Sitting, Cuff Size: Normal)   Pulse 82   Ht 5\' 5"  (1.651 m)   Wt 155 lb 3.2 oz (70.4 kg)   BMI 25.83 kg/m   General: Well-nourished, well-developed in no acute distress.  Eyes: No icterus. Conjunctivae pink. Neuro: Alert and oriented x 3.  Grossly intact. Skin: Warm and dry, no jaundice.   Psych: Alert and cooperative, normal mood and affect.  Labs:    Imaging Studies: No results found.  Assessment and Plan:   Candice Leblanc is a 69 y.o. y/o female Who comes in today for follow-up of an upper endoscopy that showed multiple small bowel polyps that were adenomatous.  The patient will be set up for a capsule endoscopy to look for any small bowel lesions that may also be present further down.  The patient will also have a repeat upper endoscopy in 6 months.  The patient has been explained the plan and agrees with it.     Lucilla Lame, MD. Marval Regal    Note: This dictation was prepared with Dragon dictation along with smaller phrase technology. Any transcriptional errors  that result from this process are unintentional.

## 2020-10-29 ENCOUNTER — Other Ambulatory Visit: Payer: Self-pay

## 2020-10-29 DIAGNOSIS — K6389 Other specified diseases of intestine: Secondary | ICD-10-CM

## 2020-11-16 ENCOUNTER — Encounter
Admission: RE | Disposition: A | Discharge status: Home / Self Care | Payer: Self-pay | Source: Home / Self Care | Attending: Gastroenterology

## 2020-11-16 ENCOUNTER — Ambulatory Visit
Admission: RE | Admit: 2020-11-16 | Discharge: 2020-11-16 | Disposition: A | Discharge status: Home / Self Care | Payer: Medicare HMO | Attending: Gastroenterology | Admitting: Gastroenterology

## 2020-11-16 DIAGNOSIS — D126 Benign neoplasm of colon, unspecified: Secondary | ICD-10-CM | POA: Insufficient documentation

## 2020-11-16 DIAGNOSIS — D1330 Benign neoplasm of unspecified part of small intestine: Secondary | ICD-10-CM | POA: Diagnosis not present

## 2020-11-16 DIAGNOSIS — K6389 Other specified diseases of intestine: Secondary | ICD-10-CM | POA: Insufficient documentation

## 2020-11-16 HISTORY — PX: GIVENS CAPSULE STUDY: SHX5432

## 2020-11-16 SURGERY — IMAGING PROCEDURE, GI TRACT, INTRALUMINAL, VIA CAPSULE

## 2020-11-17 ENCOUNTER — Encounter: Payer: Self-pay | Admitting: Gastroenterology

## 2020-11-26 ENCOUNTER — Encounter: Payer: Self-pay | Admitting: Gastroenterology

## 2021-01-25 ENCOUNTER — Other Ambulatory Visit: Payer: Self-pay | Admitting: Family Medicine

## 2021-01-25 DIAGNOSIS — Z1231 Encounter for screening mammogram for malignant neoplasm of breast: Secondary | ICD-10-CM

## 2021-02-02 DIAGNOSIS — D72819 Decreased white blood cell count, unspecified: Secondary | ICD-10-CM | POA: Diagnosis not present

## 2021-02-02 DIAGNOSIS — E7800 Pure hypercholesterolemia, unspecified: Secondary | ICD-10-CM | POA: Diagnosis not present

## 2021-02-09 DIAGNOSIS — E7800 Pure hypercholesterolemia, unspecified: Secondary | ICD-10-CM | POA: Diagnosis not present

## 2021-02-09 DIAGNOSIS — D72819 Decreased white blood cell count, unspecified: Secondary | ICD-10-CM | POA: Diagnosis not present

## 2021-02-09 DIAGNOSIS — K219 Gastro-esophageal reflux disease without esophagitis: Secondary | ICD-10-CM | POA: Diagnosis not present

## 2021-03-05 ENCOUNTER — Other Ambulatory Visit: Payer: Self-pay

## 2021-03-05 ENCOUNTER — Ambulatory Visit
Admission: RE | Admit: 2021-03-05 | Discharge: 2021-03-05 | Disposition: A | Discharge status: Home / Self Care | Payer: Medicare HMO | Source: Ambulatory Visit | Attending: Family Medicine | Admitting: Family Medicine

## 2021-03-05 DIAGNOSIS — Z1231 Encounter for screening mammogram for malignant neoplasm of breast: Secondary | ICD-10-CM | POA: Insufficient documentation

## 2021-04-28 ENCOUNTER — Telehealth: Payer: Self-pay

## 2021-04-28 NOTE — Telephone Encounter (Signed)
-----   Message from [place]ie [place], [place] sent at 10/29/2020 10:34 AM EDT ----- Call pt's daughter to schedule repeat EGD.

## 2021-04-29 ENCOUNTER — Other Ambulatory Visit: Payer: Self-pay

## 2021-04-29 DIAGNOSIS — K6389 Other specified diseases of intestine: Secondary | ICD-10-CM

## 2021-05-13 ENCOUNTER — Ambulatory Visit: Payer: Medicare HMO | Admitting: Certified Registered"

## 2021-05-13 ENCOUNTER — Other Ambulatory Visit: Payer: Self-pay

## 2021-05-13 ENCOUNTER — Encounter
Admission: RE | Disposition: A | Discharge status: Home / Self Care | Payer: Self-pay | Source: Home / Self Care | Attending: Gastroenterology

## 2021-05-13 ENCOUNTER — Ambulatory Visit
Admission: RE | Admit: 2021-05-13 | Discharge: 2021-05-13 | Disposition: A | Discharge status: Home / Self Care | Payer: Medicare HMO | Attending: Gastroenterology | Admitting: Gastroenterology

## 2021-05-13 ENCOUNTER — Encounter: Payer: Self-pay | Admitting: Gastroenterology

## 2021-05-13 DIAGNOSIS — E119 Type 2 diabetes mellitus without complications: Secondary | ICD-10-CM | POA: Diagnosis not present

## 2021-05-13 DIAGNOSIS — K219 Gastro-esophageal reflux disease without esophagitis: Secondary | ICD-10-CM | POA: Insufficient documentation

## 2021-05-13 DIAGNOSIS — Z09 Encounter for follow-up examination after completed treatment for conditions other than malignant neoplasm: Secondary | ICD-10-CM | POA: Insufficient documentation

## 2021-05-13 DIAGNOSIS — D132 Benign neoplasm of duodenum: Secondary | ICD-10-CM | POA: Diagnosis not present

## 2021-05-13 DIAGNOSIS — Z87891 Personal history of nicotine dependence: Secondary | ICD-10-CM | POA: Insufficient documentation

## 2021-05-13 DIAGNOSIS — Z9221 Personal history of antineoplastic chemotherapy: Secondary | ICD-10-CM | POA: Insufficient documentation

## 2021-05-13 DIAGNOSIS — Z79899 Other long term (current) drug therapy: Secondary | ICD-10-CM | POA: Diagnosis not present

## 2021-05-13 DIAGNOSIS — Z853 Personal history of malignant neoplasm of breast: Secondary | ICD-10-CM | POA: Insufficient documentation

## 2021-05-13 DIAGNOSIS — E785 Hyperlipidemia, unspecified: Secondary | ICD-10-CM | POA: Diagnosis not present

## 2021-05-13 DIAGNOSIS — K317 Polyp of stomach and duodenum: Secondary | ICD-10-CM

## 2021-05-13 DIAGNOSIS — K6389 Other specified diseases of intestine: Secondary | ICD-10-CM

## 2021-05-13 HISTORY — PX: ESOPHAGOGASTRODUODENOSCOPY (EGD) WITH PROPOFOL: SHX5813

## 2021-05-13 SURGERY — ESOPHAGOGASTRODUODENOSCOPY (EGD) WITH PROPOFOL
Anesthesia: General

## 2021-05-13 MED ORDER — PROPOFOL 500 MG/50ML IV EMUL
INTRAVENOUS | Status: DC | PRN
  Administered 2021-05-13: 140 ug/kg/min via INTRAVENOUS

## 2021-05-13 MED ORDER — LIDOCAINE HCL (CARDIAC) PF 100 MG/5ML IV SOSY
PREFILLED_SYRINGE | INTRAVENOUS | Status: DC | PRN
Start: 2021-05-13 — End: 2021-05-13
  Administered 2021-05-13: 60 mg via INTRAVENOUS

## 2021-05-13 MED ORDER — SODIUM CHLORIDE 0.9 % IV SOLN: INTRAVENOUS | Status: DC

## 2021-05-13 MED ORDER — PHENYLEPHRINE 40 MCG/ML (10ML) SYRINGE FOR IV PUSH (FOR BLOOD PRESSURE SUPPORT)
PREFILLED_SYRINGE | INTRAVENOUS | Status: DC | PRN
  Administered 2021-05-13: 10 ug via INTRAVENOUS

## 2021-05-13 NOTE — Anesthesia Postprocedure Evaluation (Signed)
Anesthesia Post Note  Patient: Candice Leblanc  Procedure(s) Performed: ESOPHAGOGASTRODUODENOSCOPY (EGD) WITH PROPOFOL  Patient location during evaluation: Endoscopy Anesthesia Type: General Level of consciousness: awake and alert Pain management: pain level controlled Vital Signs Assessment: post-procedure vital signs reviewed and stable Respiratory status: spontaneous breathing, nonlabored ventilation, respiratory function stable and patient connected to nasal cannula oxygen Cardiovascular status: blood pressure returned to baseline and stable Postop Assessment: no apparent nausea or vomiting Anesthetic complications: no   No notable events documented.   Last Vitals:  Vitals:   05/13/21 1021 05/13/21 1031  BP: 130/70 (!) 148/72  Pulse: 77   Resp: 18   Temp:    SpO2: 100%     Last Pain:  Vitals:   05/13/21 1031  TempSrc:   PainSc: 0-No pain                 Arita Miss

## 2021-05-13 NOTE — Anesthesia Preprocedure Evaluation (Signed)
Anesthesia Evaluation  Patient identified by MRN, date of birth, ID band Patient awake    Reviewed: Allergy & Precautions, NPO status , Patient's Chart, lab work & pertinent test results  History of Anesthesia Complications Negative for: history of anesthetic complications  Airway Mallampati: II  TM Distance: >3 FB Neck ROM: Full    Dental no notable dental hx. (+) Teeth Intact   Pulmonary neg sleep apnea, neg COPD, Patient abstained from smoking.Not current smoker, former smoker,    Pulmonary exam normal breath sounds clear to auscultation       Cardiovascular Exercise Tolerance: Good METS(-) hypertension(-) CAD and (-) Past MI negative cardio ROS  (-) dysrhythmias  Rhythm:Regular Rate:Normal - Systolic murmurs    Neuro/Psych negative neurological ROS  negative psych ROS   GI/Hepatic GERD  ,(+)     (-) substance abuse  ,   Endo/Other  diabetes  Renal/GU negative Renal ROS     Musculoskeletal   Abdominal   Peds  Hematology   Anesthesia Other Findings Past Medical History: 2006: Breast cancer ([place])     Comment:  LT MASTECTOMY No date: Diabetes mellitus without complication (H[facility])     Comment:  borderline No date: GERD (gastroesophageal reflux disease) No date: Hyperlipidemia 2006: S/P chemotherapy, time since greater than 12 weeks     Comment:  BREAST CA No date: Vitamin D deficiency  Reproductive/Obstetrics                             Anesthesia Physical Anesthesia Plan  ASA: 2  Anesthesia Plan: General   Post-op Pain Management: Minimal or no pain anticipated   Induction: Intravenous  PONV Risk Score and Plan: 3 and Propofol infusion, TIVA and Ondansetron  Airway Management Planned: Nasal Cannula  Additional Equipment: None  Intra-op Plan:   Post-operative Plan:   Informed Consent: I have reviewed the patients History and Physical, chart, labs and discussed the  procedure including the risks, benefits and alternatives for the proposed anesthesia with the patient or authorized representative who has indicated his/her understanding and acceptance.     Dental advisory given  Plan Discussed with: CRNA and Surgeon  Anesthesia Plan Comments: (Discussed risks of anesthesia with patient, including possibility of difficulty with spontaneous ventilation under anesthesia necessitating airway intervention, PONV, and rare risks such as cardiac or respiratory or neurological events, and allergic reactions. Discussed the role of CRNA in patient's perioperative care. Patient understands.)        Anesthesia Quick Evaluation

## 2021-05-13 NOTE — Transfer of Care (Signed)
Immediate Anesthesia Transfer of Care Note  Patient: Candice Leblanc  Procedure(s) Performed: ESOPHAGOGASTRODUODENOSCOPY (EGD) WITH PROPOFOL  Patient Location: PACU and Endoscopy Unit  Anesthesia Type:General  Level of Consciousness: drowsy  Airway & Oxygen Therapy: Patient Spontanous Breathing  Post-op Assessment: Report given to RN  Post vital signs: stable  Last Vitals:  Vitals Value Taken Time  BP    Temp    Pulse    Resp    SpO2      Last Pain:  Vitals:   05/13/21 0918  TempSrc: Temporal  PainSc: 0-No pain         Complications: No notable events documented.

## 2021-05-13 NOTE — H&P (Signed)
Candice Lame, MD [address], [place] [address] Phone:[phone] Fax : [phone]  Primary Care Physician:  Candice Body, MD Primary Gastroenterologist:  Candice Leblanc  Pre-Procedure History & Physical: HPI:  Candice Leblanc is a 71 y.o. female is here for an endoscopy.   Past Medical History:  Diagnosis Date   Breast cancer ([place]) 2006   LT MASTECTOMY   Diabetes mellitus without complication ([place])    borderline   GERD (gastroesophageal reflux disease)    Hyperlipidemia    S/P chemotherapy, time since greater than 12 weeks 2006   BREAST CA   Vitamin D deficiency     Past Surgical History:  Procedure Laterality Date   BREAST EXCISIONAL BIOPSY Right 2000?   EXCISIONAL - NEG   COLONOSCOPY WITH PROPOFOL N/A 04/13/2015   Procedure: COLONOSCOPY WITH PROPOFOL;  Surgeon: Hulen Luster, MD;  Location: [facility] ENDOSCOPY;  Service: Gastroenterology;  Laterality: N/A;   ERCP N/A 09/03/2020   Procedure: ENDOSCOPIC RETROGRADE CHOLANGIOPANCREATOGRAPHY (ERCP);  Surgeon: Candice Lame, MD;  Location: [facility] ENDOSCOPY;  Service: Endoscopy;  Laterality: N/A;   ESOPHAGOGASTRODUODENOSCOPY (EGD) WITH PROPOFOL N/A 10/13/2020   Procedure: ESOPHAGOGASTRODUODENOSCOPY (EGD) WITH PROPOFOL;  Surgeon: Candice Lame, MD;  Location: [facility] ENDOSCOPY;  Service: Endoscopy;  Laterality: N/A;   GIVENS CAPSULE STUDY N/A 11/16/2020   Procedure: GIVENS CAPSULE STUDY;  Surgeon: Candice Lame, MD;  Location: [facility] ENDOSCOPY;  Service: Endoscopy;  Laterality: N/A;   MASTECTOMY Left 2006   breast ca    Prior to Admission medications   Medication Sig Start Date End Date Taking? Authorizing Provider  atorvastatin (LIPITOR) 20 MG tablet Take 30 mg by mouth daily.   Yes [provider]  Calcium Carb-Cholecalciferol 600-400 MG-UNIT TABS Take 1 tablet by mouth in the morning and at bedtime.   Yes [provider]  famotidine (PEPCID) 40 MG tablet Take 40 mg by mouth 2 (two) times  daily.   Yes [provider]  acetaminophen (TYLENOL) 500 MG tablet Take 2 tablets (1,000 mg total) by mouth every 6 (six) hours as needed for mild pain. 09/05/20   Piscoya, Jacqulyn Bath, MD  fluticasone (FLONASE) 50 MCG/ACT nasal spray Place 2 sprays into both nostrils daily.    [provider]  ibuprofen (ADVIL) 600 MG tablet Take 1 tablet (600 mg total) by mouth every 8 (eight) hours as needed for moderate pain. 09/05/20   Candice Ree, MD    Allergies as of 04/29/2021 - Review Complete 10/27/2020  Allergen Reaction Noted   Aspirin Other (See Comments) 04/10/2015   Other Other (See Comments) 06/09/2014    Family History  Problem Relation Age of Onset   Breast cancer Sister 82    Social History   Socioeconomic History   Marital status: Divorced    Spouse name: Not on file   Number of children: Not on file   Years of education: Not on file   Highest education level: Not on file  Occupational History   Not on file  Tobacco Use   Smoking status: Former    Packs/day: 1.00    Years: 10.00    Pack years: 10.00    Types: Cigarettes   Smokeless tobacco: Never  Vaping Use   Vaping Use: Never used  Substance and Sexual Activity   Alcohol use: Never   Drug use: No   Sexual activity: Not on file  Other Topics Concern   Not on file  Social History Narrative   Not on file  Social Determinants of Health   Financial Resource Strain: Not on file  Food Insecurity: Not on file  Transportation Needs: Not on file  Physical Activity: Not on file  Stress: Not on file  Social Connections: Not on file  Intimate Partner Violence: Not on file    Review of Systems: See HPI, otherwise negative ROS  Physical Exam: BP (!) 136/97    Pulse (!) 109    Temp 98.1 F (36.7 C) (Temporal)    Resp 18    Ht 5\' 6"  (1.676 m)    Wt 70.8 kg    SpO2 99%    BMI 25.18 kg/m  General:   Alert,  pleasant and cooperative in NAD Head:  Normocephalic and atraumatic. Neck:  Supple; no masses or  thyromegaly. Lungs:  Clear throughout to auscultation.    Heart:  Regular rate and rhythm. Abdomen:  Soft, nontender and nondistended. Normal bowel sounds, without guarding, and without rebound.   Neurologic:  Alert and  oriented x4;  grossly normal neurologically.  Impression/Plan: Candice Leblanc is here for an endoscopy to be performed for a history of adenomatous polyps on 09/2020   Risks, benefits, limitations, and alternatives regarding  endoscopy have been reviewed with the patient.  Questions have been answered.  All parties agreeable.   Candice Lame, MD  05/13/2021, 9:48 AM

## 2021-05-13 NOTE — Op Note (Signed)
[facility] Gastroenterology Patient Name: Candice Leblanc Procedure Date: 05/13/2021 9:54 AM MRN: [id number] Account #: [id number] Date of Birth: [birth year] Admit Type: Outpatient Age: 71 Room: [facility] ENDO ROOM 4 Gender: Female Note Status: Finalized Instrument Name: Upper Endoscope [id number] Procedure:             Upper GI endoscopy Indications:           Follow-up of polyps in the duodenum Providers:             Lucilla Lame MD, MD Referring MD:          Dion Body (Referring MD) Medicines:             Propofol per Anesthesia Complications:         No immediate complications. Procedure:             Pre-Anesthesia Assessment:                        - Prior to the procedure, a History and Physical was                         performed, and patient medications and allergies were                         reviewed. The patient's tolerance of previous                         anesthesia was also reviewed. The risks and benefits                         of the procedure and the sedation options and risks                         were discussed with the patient. All questions were                         answered, and informed consent was obtained. Prior                         Anticoagulants: The patient has taken no previous                         anticoagulant or antiplatelet agents. ASA Grade                         Assessment: II - A patient with mild systemic disease.                         After reviewing the risks and benefits, the patient                         was deemed in satisfactory condition to undergo the                         procedure.                        After obtaining informed consent, the endoscope was  passed under direct vision. Throughout the procedure,                         the patient's blood pressure, pulse, and oxygen                         saturations were monitored continuously. The Endoscope                          was introduced through the mouth, and advanced to the                         second part of duodenum. The upper GI endoscopy was                         accomplished without difficulty. The patient tolerated                         the procedure well. Findings:      The examined esophagus was normal.      The entire examined stomach was normal.      Two 5 mm sessile polyps with no bleeding were found in the second       portion of the duodenum. The polyp was removed with a hot snare.       Resection and retrieval were complete.      One 2 mm sessile polyp was found in the second portion of the duodenum.       The polyp was removed with a cold biopsy forceps. Resection and       retrieval were complete.      One 4 mm sessile polyp with no bleeding was found in the second portion       of the duodenum. The polyp was removed with a cold snare. Resection and       retrieval were complete. Impression:            - Normal esophagus.                        - Normal stomach.                        - Two duodenal polyps. Resected and retrieved.                        - One duodenal polyp. Resected and retrieved.                        - One duodenal polyp. Resected and retrieved. Recommendation:        - Discharge patient to home.                        - Resume previous diet.                        - Continue present medications.                        - Await pathology results. Procedure Code(s):     --- Professional ---  43251, Esophagogastroduodenoscopy, flexible,                         transoral; with removal of tumor(s), polyp(s), or                         other lesion(s) by snare technique                        43239, 64, Esophagogastroduodenoscopy, flexible,                         transoral; with biopsy, single or multiple Diagnosis Code(s):     --- Professional ---                        K31.7, Polyp of stomach and duodenum CPT copyright 2019  American Medical Association. All rights reserved. The codes documented in this report are preliminary and upon coder review may  be revised to meet current compliance requirements. Lucilla Lame MD, MD 05/13/2021 10:10:58 AM This report has been signed electronically. Number of Addenda: 0 Note Initiated On: 05/13/2021 9:54 AM Estimated Blood Loss:  Estimated blood loss: none.      [facility]

## 2021-05-14 ENCOUNTER — Encounter: Payer: Self-pay | Admitting: Gastroenterology

## 2021-05-14 LAB — SURGICAL PATHOLOGY

## 2021-05-19 ENCOUNTER — Telehealth: Payer: Self-pay

## 2021-05-19 NOTE — Telephone Encounter (Signed)
error 

## 2021-08-13 DIAGNOSIS — R7309 Other abnormal glucose: Secondary | ICD-10-CM | POA: Diagnosis not present

## 2021-08-13 DIAGNOSIS — D72819 Decreased white blood cell count, unspecified: Secondary | ICD-10-CM | POA: Diagnosis not present

## 2021-08-13 DIAGNOSIS — E7800 Pure hypercholesterolemia, unspecified: Secondary | ICD-10-CM | POA: Diagnosis not present

## 2021-08-20 DIAGNOSIS — N289 Disorder of kidney and ureter, unspecified: Secondary | ICD-10-CM | POA: Diagnosis not present

## 2021-08-20 DIAGNOSIS — Z1389 Encounter for screening for other disorder: Secondary | ICD-10-CM | POA: Diagnosis not present

## 2021-08-20 DIAGNOSIS — R7303 Prediabetes: Secondary | ICD-10-CM | POA: Diagnosis not present

## 2021-08-20 DIAGNOSIS — Z0000 Encounter for general adult medical examination without abnormal findings: Secondary | ICD-10-CM | POA: Diagnosis not present

## 2021-10-04 ENCOUNTER — Telehealth: Payer: Self-pay

## 2021-10-04 NOTE — Telephone Encounter (Signed)
Patient lvm requesting to schedule EGD.  Last EGD was performed by Dr. Allen Norris 05/13/21 Duodenal Polyps.  Please advise as to if she is due for her repeat EGD.  Thanks, [place], [place]

## 2021-10-05 ENCOUNTER — Telehealth: Payer: Self-pay

## 2021-10-05 ENCOUNTER — Other Ambulatory Visit: Payer: Self-pay

## 2021-10-05 DIAGNOSIS — D132 Benign neoplasm of duodenum: Secondary | ICD-10-CM

## 2021-10-05 NOTE — Telephone Encounter (Signed)
Patient requested an EGD to be scheduled with Dr. Allen Norris.  Per Dr. Allen Norris patient has been scheduled an EGD for Duodenal Adenomatous Polyp.  Her EGD has been scheduled for Tuesday 11/23/21 at [facility].  Instructions have been mailed to patient.  Thanks, [place], [place]

## 2021-11-22 ENCOUNTER — Encounter: Payer: Self-pay | Admitting: Gastroenterology

## 2021-11-23 ENCOUNTER — Encounter
Admission: RE | Disposition: A | Discharge status: Home / Self Care | Payer: Self-pay | Source: Home / Self Care | Attending: Gastroenterology

## 2021-11-23 ENCOUNTER — Ambulatory Visit
Admission: RE | Admit: 2021-11-23 | Discharge: 2021-11-23 | Disposition: A | Discharge status: Home / Self Care | Payer: Medicare HMO | Attending: Gastroenterology | Admitting: Gastroenterology

## 2021-11-23 ENCOUNTER — Ambulatory Visit: Payer: Medicare HMO | Admitting: General Practice

## 2021-11-23 ENCOUNTER — Encounter: Payer: Self-pay | Admitting: Gastroenterology

## 2021-11-23 DIAGNOSIS — D132 Benign neoplasm of duodenum: Secondary | ICD-10-CM | POA: Diagnosis not present

## 2021-11-23 DIAGNOSIS — K317 Polyp of stomach and duodenum: Secondary | ICD-10-CM | POA: Diagnosis not present

## 2021-11-23 HISTORY — PX: ESOPHAGOGASTRODUODENOSCOPY (EGD) WITH PROPOFOL: SHX5813

## 2021-11-23 SURGERY — ESOPHAGOGASTRODUODENOSCOPY (EGD) WITH PROPOFOL
Anesthesia: General

## 2021-11-23 MED ORDER — SODIUM CHLORIDE 0.9 % IV SOLN: INTRAVENOUS | Status: DC

## 2021-11-23 MED ORDER — PROPOFOL 10 MG/ML IV BOLUS
INTRAVENOUS | Status: DC | PRN
  Administered 2021-11-23: 40 mg via INTRAVENOUS
  Administered 2021-11-23: 100 mg via INTRAVENOUS

## 2021-11-23 MED ORDER — LIDOCAINE HCL (CARDIAC) PF 100 MG/5ML IV SOSY
PREFILLED_SYRINGE | INTRAVENOUS | Status: DC | PRN
  Administered 2021-11-23: 100 mg via INTRAVENOUS

## 2021-11-23 NOTE — Anesthesia Preprocedure Evaluation (Signed)
Anesthesia Evaluation  Patient identified by MRN, date of birth, ID band Patient awake    Reviewed: Allergy & Precautions, NPO status , Patient's Chart, lab work & pertinent test results  History of Anesthesia Complications Negative for: history of anesthetic complications  Airway Mallampati: III  TM Distance: >3 FB Neck ROM: full    Dental  (+) Chipped   Pulmonary neg pulmonary ROS, neg shortness of breath, neg sleep apnea, neg recent URI, former smoker,    Pulmonary exam normal        Cardiovascular (-) angina(-) CAD, (-) Past MI and (-) CABG negative cardio ROS Normal cardiovascular exam     Neuro/Psych negative neurological ROS  negative psych ROS   GI/Hepatic Neg liver ROS, GERD  ,  Endo/Other  negative endocrine ROS  Renal/GU negative Renal ROS  negative genitourinary   Musculoskeletal   Abdominal   Peds  Hematology negative hematology ROS (+)   Anesthesia Other Findings Past Medical History: 2006: Breast cancer ([place])     Comment:  LT MASTECTOMY No date: GERD (gastroesophageal reflux disease) No date: Hyperlipidemia 2006: S/P chemotherapy, time since greater than 12 weeks     Comment:  BREAST CA No date: Vitamin D deficiency  Past Surgical History: 2000?: BREAST EXCISIONAL BIOPSY; Right     Comment:  EXCISIONAL - NEG 04/13/2015: COLONOSCOPY WITH PROPOFOL; N/A     Comment:  Procedure: COLONOSCOPY WITH PROPOFOL;  Surgeon: Hulen Luster, MD;  Location: [facility] ENDOSCOPY;  Service:               Gastroenterology;  Laterality: N/A; 09/03/2020: ERCP; N/A     Comment:  Procedure: ENDOSCOPIC RETROGRADE               CHOLANGIOPANCREATOGRAPHY (ERCP);  Surgeon: Lucilla Lame,               MD;  Location: [facility] ENDOSCOPY;  Service: Endoscopy;                Laterality: N/A; 10/13/2020: ESOPHAGOGASTRODUODENOSCOPY (EGD) WITH PROPOFOL; N/A     Comment:  Procedure: ESOPHAGOGASTRODUODENOSCOPY (EGD) WITH                PROPOFOL;  Surgeon: Lucilla Lame, MD;  Location: [facility]               ENDOSCOPY;  Service: Endoscopy;  Laterality: N/A; 05/13/2021: ESOPHAGOGASTRODUODENOSCOPY (EGD) WITH PROPOFOL; N/A     Comment:  Procedure: ESOPHAGOGASTRODUODENOSCOPY (EGD) WITH               PROPOFOL;  Surgeon: Lucilla Lame, MD;  Location: [facility]               ENDOSCOPY;  Service: Endoscopy;  Laterality: N/A; 11/16/2020: GIVENS CAPSULE STUDY; N/A     Comment:  Procedure: GIVENS CAPSULE STUDY;  Surgeon: Lucilla Lame,              MD;  Location: [facility] ENDOSCOPY;  Service: Endoscopy;                Laterality: N/A; 2006: MASTECTOMY; Left     Comment:  breast ca  BMI    Body Mass Index: 26.15 kg/m      Reproductive/Obstetrics negative OB ROS                             Anesthesia Physical Anesthesia Plan  ASA:  2  Anesthesia Plan: General   Post-op Pain Management: Minimal or no pain anticipated   Induction: Intravenous  PONV Risk Score and Plan: 3 and Propofol infusion, TIVA and Ondansetron  Airway Management Planned: Nasal Cannula  Additional Equipment: None  Intra-op Plan:   Post-operative Plan:   Informed Consent: I have reviewed the patients History and Physical, chart, labs and discussed the procedure including the risks, benefits and alternatives for the proposed anesthesia with the patient or authorized representative who has indicated his/her understanding and acceptance.     Dental advisory given  Plan Discussed with: CRNA and Surgeon  Anesthesia Plan Comments: (Discussed risks of anesthesia with patient, including possibility of difficulty with spontaneous ventilation under anesthesia necessitating airway intervention, PONV, and rare risks such as cardiac or respiratory or neurological events, and allergic reactions. Discussed the role of CRNA in patient's perioperative care. Patient understands.)        Anesthesia Quick Evaluation

## 2021-11-23 NOTE — Op Note (Signed)
[facility] Gastroenterology Patient Name: Candice Leblanc Procedure Date: 11/23/2021 8:48 AM MRN: [id number] Account #: [id number] Date of Birth: Sep 02, 1950 Admit Type: Outpatient Age: 71 Room: [facility] ENDO ROOM 4 Gender: Female Note Status: Finalized Instrument Name: Upper Endoscope [id number] Procedure:             Upper GI endoscopy Indications:           Follow-up of polyps in the duodenum Providers:             Lucilla Lame MD, MD Referring MD:          Dion Body (Referring MD) Medicines:             Propofol per Anesthesia Complications:         No immediate complications. Procedure:             Pre-Anesthesia Assessment:                        - Prior to the procedure, a History and Physical was                         performed, and patient medications and allergies were                         reviewed. The patient's tolerance of previous                         anesthesia was also reviewed. The risks and benefits                         of the procedure and the sedation options and risks                         were discussed with the patient. All questions were                         answered, and informed consent was obtained. Prior                         Anticoagulants: The patient has taken no previous                         anticoagulant or antiplatelet agents. ASA Grade                         Assessment: II - A patient with mild systemic disease.                         After reviewing the risks and benefits, the patient                         was deemed in satisfactory condition to undergo the                         procedure.                        After obtaining informed consent, the endoscope was  passed under direct vision. Throughout the procedure,                         the patient's blood pressure, pulse, and oxygen                         saturations were monitored continuously. The Endoscope                          was introduced through the mouth, and advanced to the                         second part of duodenum. The upper GI endoscopy was                         accomplished without difficulty. The patient tolerated                         the procedure well. Findings:      The examined esophagus was normal.      The entire examined stomach was normal.      A single 7 mm sessile polyp was found in the duodenal bulb. The polyp       was removed with a cold snare. Resection and retrieval were complete. Impression:            - Normal esophagus.                        - Normal stomach.                        - A single duodenal polyp. Resected and retrieved. Recommendation:        - Discharge patient to home.                        - Resume previous diet.                        - Continue present medications.                        - Await pathology results. Procedure Code(s):     --- Professional ---                        [phone], Esophagogastroduodenoscopy, flexible,                         transoral; with removal of tumor(s), polyp(s), or                         other lesion(s) by snare technique Diagnosis Code(s):     --- Professional ---                        K31.7, Polyp of stomach and duodenum CPT copyright 2019 American Medical Association. All rights reserved. The codes documented in this report are preliminary and upon coder review may  be revised to meet current compliance requirements. Lucilla Lame MD, MD 11/23/2021 9:08:56 AM This report has been signed electronically. Number of Addenda: 0 Note Initiated On: 11/23/2021  8:48 AM Estimated Blood Loss:  Estimated blood loss: none.      [facility]

## 2021-11-23 NOTE — Anesthesia Postprocedure Evaluation (Signed)
Anesthesia Post Note  Patient: Candice Leblanc  Procedure(s) Performed: ESOPHAGOGASTRODUODENOSCOPY (EGD) WITH PROPOFOL  Patient location during evaluation: Endoscopy Anesthesia Type: General Level of consciousness: awake and alert Pain management: pain level controlled Vital Signs Assessment: post-procedure vital signs reviewed and stable Respiratory status: spontaneous breathing, nonlabored ventilation, respiratory function stable and patient connected to nasal cannula oxygen Cardiovascular status: blood pressure returned to baseline and stable Postop Assessment: no apparent nausea or vomiting Anesthetic complications: no   No notable events documented.   Last Vitals:  Vitals:   11/23/21 0922 11/23/21 0932  BP: 133/69 (!) 151/86  Pulse: 72 66  Resp: 13 12  Temp:    SpO2: 100% 98%    Last Pain:  Vitals:   11/23/21 0932  TempSrc:   PainSc: 0-No pain                 Dimas Millin

## 2021-11-23 NOTE — Transfer of Care (Signed)
Immediate Anesthesia Transfer of Care Note  Patient: Candice Leblanc  Procedure(s) Performed: ESOPHAGOGASTRODUODENOSCOPY (EGD) WITH PROPOFOL  Patient Location: Endoscopy Unit  Anesthesia Type:General  Level of Consciousness: drowsy  Airway & Oxygen Therapy: Patient Spontanous Breathing and Patient connected to nasal cannula oxygen  Post-op Assessment: Report given to RN, Post -op Vital signs reviewed and stable and Patient moving all extremities  Post vital signs: Reviewed and stable  Last Vitals:  Vitals Value Taken Time  BP 139/121 11/23/21 0912  Temp 36.1 C 11/23/21 0912  Pulse 81 11/23/21 0913  Resp 19 11/23/21 0913  SpO2 97 % 11/23/21 0913  Vitals shown include unvalidated device data.  Last Pain:  Vitals:   11/23/21 0912  TempSrc: Tympanic  PainSc: Asleep         Complications: No notable events documented.

## 2021-11-23 NOTE — H&P (Signed)
Candice Lame, MD [address] [place], [place] [address] Phone:[phone] Fax : [phone]  Primary Care Physician:  Dion Body, MD Primary Gastroenterologist:  Dr. Allen Norris  Pre-Procedure History & Physical: HPI:  Candice Leblanc is a 71 y.o. female is here for an endoscopy.   Past Medical History:  Diagnosis Date   Breast cancer ([place]) 2006   LT MASTECTOMY   GERD (gastroesophageal reflux disease)    Hyperlipidemia    S/P chemotherapy, time since greater than 12 weeks 2006   BREAST CA   Vitamin D deficiency     Past Surgical History:  Procedure Laterality Date   BREAST EXCISIONAL BIOPSY Right 2000?   EXCISIONAL - NEG   COLONOSCOPY WITH PROPOFOL N/A 04/13/2015   Procedure: COLONOSCOPY WITH PROPOFOL;  Surgeon: Hulen Luster, MD;  Location: [facility] ENDOSCOPY;  Service: Gastroenterology;  Laterality: N/A;   ERCP N/A 09/03/2020   Procedure: ENDOSCOPIC RETROGRADE CHOLANGIOPANCREATOGRAPHY (ERCP);  Surgeon: Candice Lame, MD;  Location: [facility] ENDOSCOPY;  Service: Endoscopy;  Laterality: N/A;   ESOPHAGOGASTRODUODENOSCOPY (EGD) WITH PROPOFOL N/A 10/13/2020   Procedure: ESOPHAGOGASTRODUODENOSCOPY (EGD) WITH PROPOFOL;  Surgeon: Candice Lame, MD;  Location: [facility] ENDOSCOPY;  Service: Endoscopy;  Laterality: N/A;   ESOPHAGOGASTRODUODENOSCOPY (EGD) WITH PROPOFOL N/A 05/13/2021   Procedure: ESOPHAGOGASTRODUODENOSCOPY (EGD) WITH PROPOFOL;  Surgeon: Candice Lame, MD;  Location: [facility] ENDOSCOPY;  Service: Endoscopy;  Laterality: N/A;   GIVENS CAPSULE STUDY N/A 11/16/2020   Procedure: GIVENS CAPSULE STUDY;  Surgeon: Candice Lame, MD;  Location: [facility] ENDOSCOPY;  Service: Endoscopy;  Laterality: N/A;   MASTECTOMY Left 2006   breast ca    Prior to Admission medications   Medication Sig Start Date End Date Taking? Authorizing Provider  acetaminophen (TYLENOL) 500 MG tablet Take 2 tablets (1,000 mg total) by mouth every 6 (six) hours as needed for mild pain. 09/05/20   Olean Ree, MD  atorvastatin (LIPITOR) 20 MG tablet Take 30 mg by mouth daily.    [provider]  Calcium Carb-Cholecalciferol 600-400 MG-UNIT TABS Take 1 tablet by mouth in the morning and at bedtime.    [provider]  famotidine (PEPCID) 40 MG tablet Take 40 mg by mouth 2 (two) times daily.    [provider]  fluticasone (FLONASE) 50 MCG/ACT nasal spray Place 2 sprays into both nostrils daily.    [provider]  ibuprofen (ADVIL) 600 MG tablet Take 1 tablet (600 mg total) by mouth every 8 (eight) hours as needed for moderate pain. 09/05/20   Olean Ree, MD    Allergies as of 10/05/2021 - Review Complete 05/13/2021  Allergen Reaction Noted   Aspirin Other (See Comments) 04/10/2015   Other Other (See Comments) 06/09/2014    Family History  Problem Relation Age of Onset   Breast cancer Sister 29    Social History   Socioeconomic History   Marital status: Divorced    Spouse name: Not on file   Number of children: Not on file   Years of education: Not on file   Highest education level: Not on file  Occupational History   Not on file  Tobacco Use   Smoking status: Former    Packs/day: 1.00    Years: 10.00    Total pack years: 10.00    Types: Cigarettes   Smokeless tobacco: Never  Vaping Use   Vaping Use: Never used  Substance and Sexual Activity   Alcohol use: Never   Drug use: No   Sexual activity: Not on file  Other Topics Concern   Not on file  Social History Narrative   Not on file   Social Determinants of Health   Financial Resource Strain: Not on file  Food Insecurity: Not on file  Transportation Needs: Not on file  Physical Activity: Not on file  Stress: Not on file  Social Connections: Not on file  Intimate Partner Violence: Not on file    Review of Systems: See HPI, otherwise negative ROS  Physical Exam: BP (!) 162/73   Pulse 97   Temp (!) 97.2 F (36.2 C) (Temporal)   Resp 16   Ht '5\' 6"'$  (1.676 m)   Wt 73.5  kg   SpO2 100%   BMI 26.15 kg/m  General:   Alert,  pleasant and cooperative in NAD Head:  Normocephalic and atraumatic. Neck:  Supple; no masses or thyromegaly. Lungs:  Clear throughout to auscultation.    Heart:  Regular rate and rhythm. Abdomen:  Soft, nontender and nondistended. Normal bowel sounds, without guarding, and without rebound.   Neurologic:  Alert and  oriented x4;  grossly normal neurologically.  Impression/Plan: Candice Leblanc is here for an endoscopy to be performed for duodenal adenomas  Risks, benefits, limitations, and alternatives regarding  endoscopy have been reviewed with the patient.  Questions have been answered.  All parties agreeable.   Candice Lame, MD  11/23/2021, 8:46 AM

## 2021-11-24 ENCOUNTER — Encounter: Payer: Self-pay | Admitting: Gastroenterology

## 2021-11-24 LAB — SURGICAL PATHOLOGY

## 2021-12-28 IMAGING — CT CT ABD-PELV W/O CM
2 of 4 series · 16 of 46 positions shown, 18 images · non-contrast
Comparison: None.

CLINICAL DATA: Epigastric pain since last night. Patient denies
nausea, vomiting and diarrhea. Elevated liver function studies.
Biliary obstruction suspected.

EXAM:
CT ABDOMEN AND PELVIS WITHOUT CONTRAST
TECHNIQUE: Multidetector CT imaging of the abdomen and pelvis was performed
following the standard protocol without IV contrast.

[Series 2: axial st · axial · 0.69mm/px · z∈[-488,-92]mm · 13 of 87 slices shown, 15 images]
[im 4/87  soft-tissue]
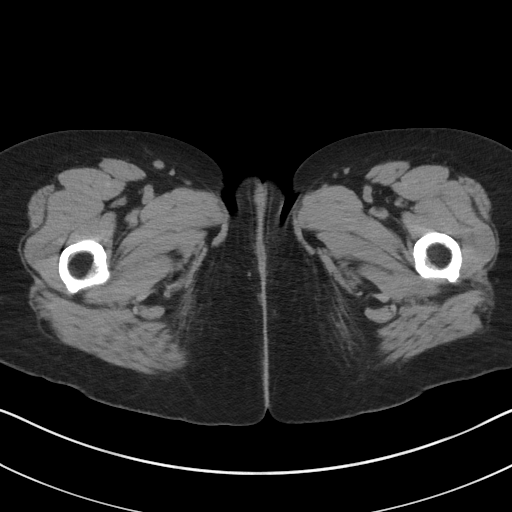
[im 4/87  bone]
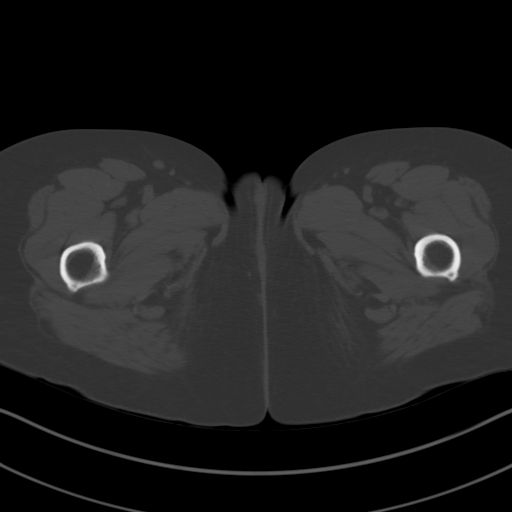
[im 12/87  soft-tissue]
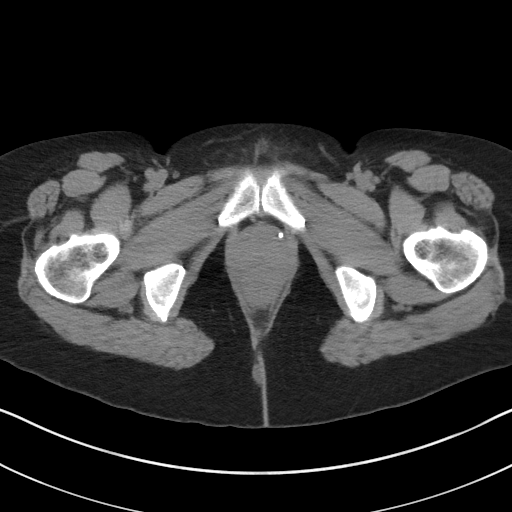
[im 19/87  soft-tissue]
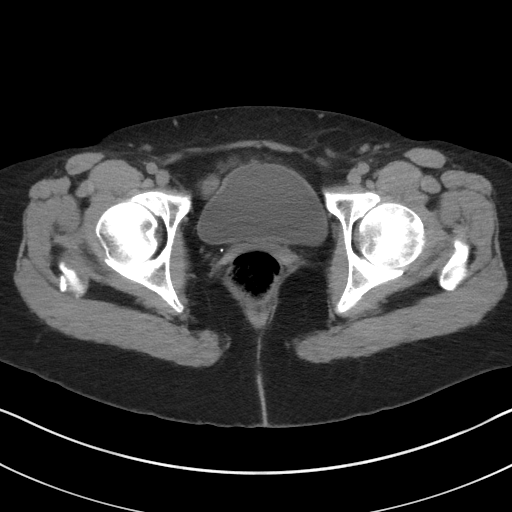
[im 23/87  soft-tissue]
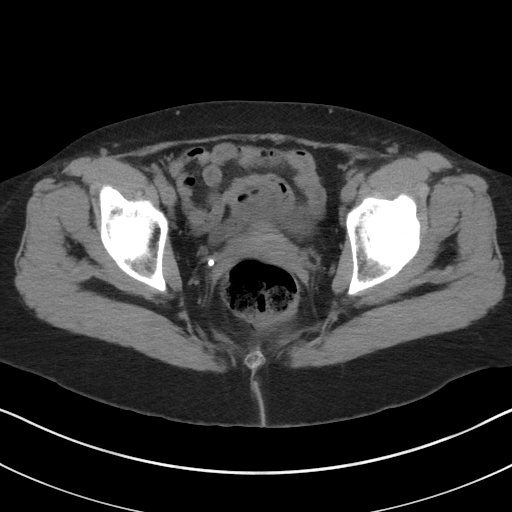
[im 30/87  soft-tissue]
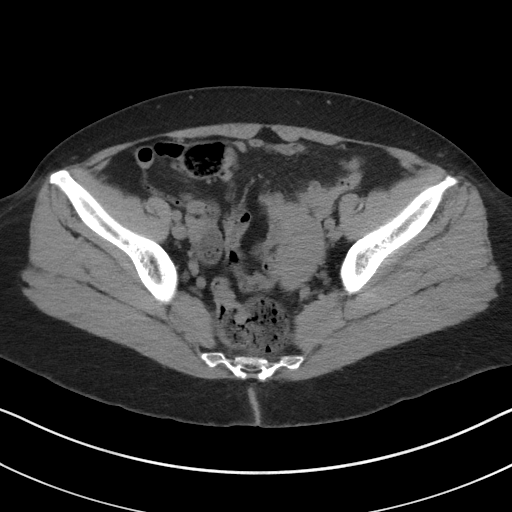
[im 38/87  soft-tissue]
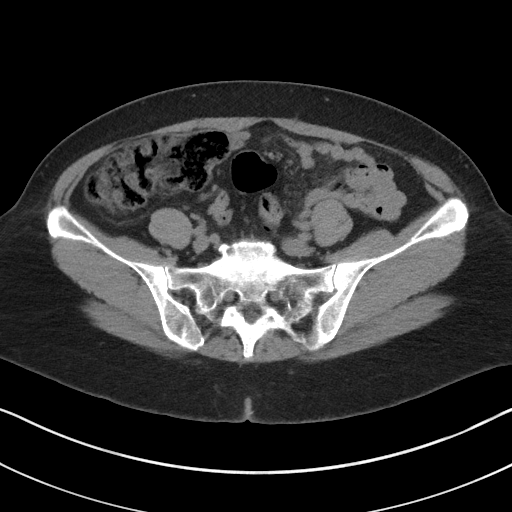
[im 45/87  soft-tissue]
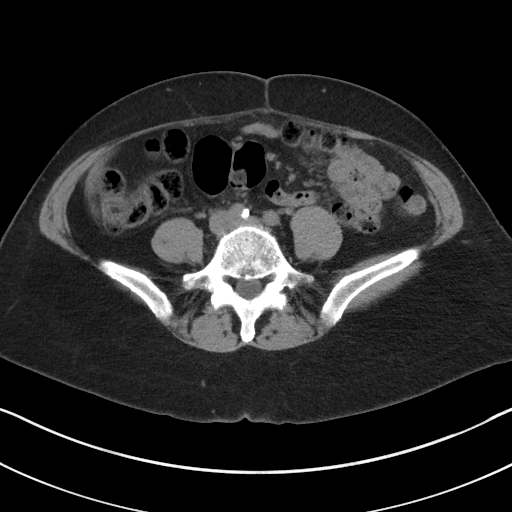
[im 49/87  soft-tissue]
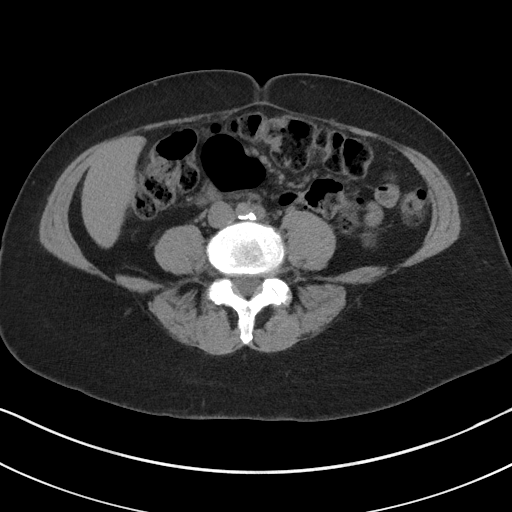
[im 57/87  soft-tissue]
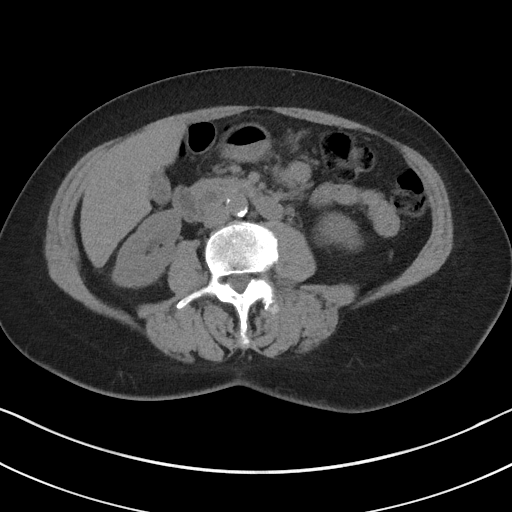
[im 57/87  bone]
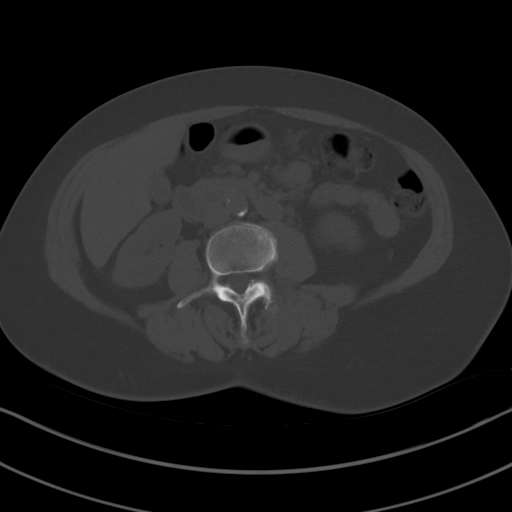
[im 64/87  soft-tissue]
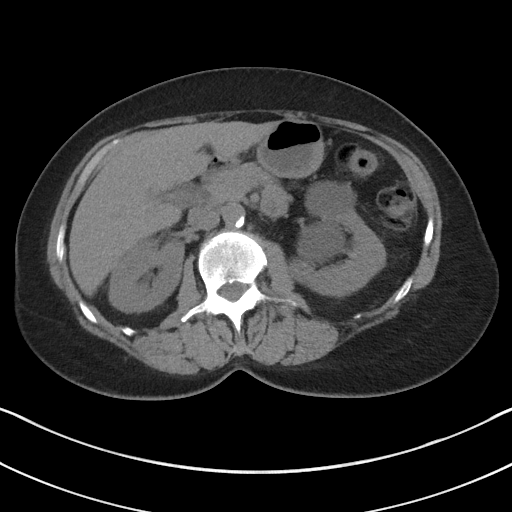
[im 68/87  soft-tissue]
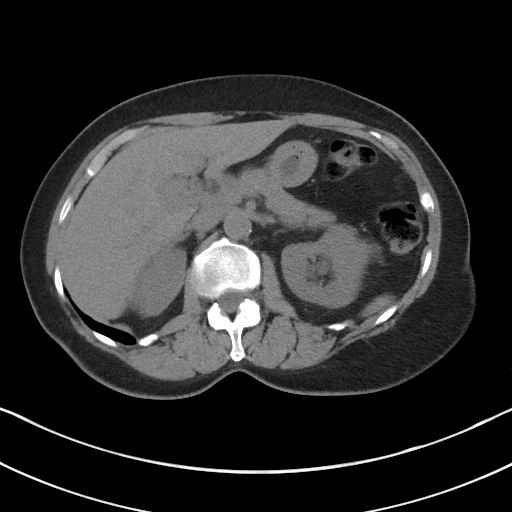
[im 75/87  soft-tissue]
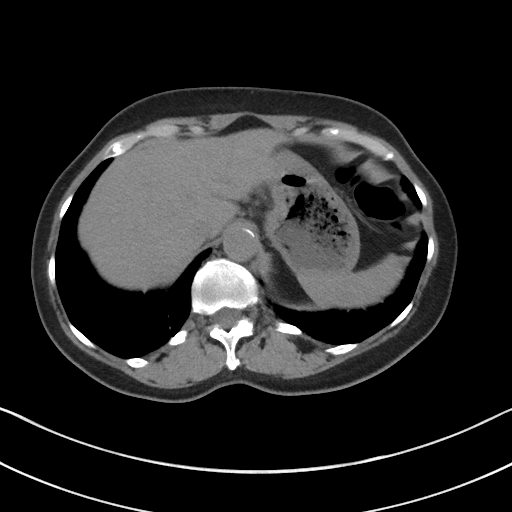
[im 83/87  soft-tissue]
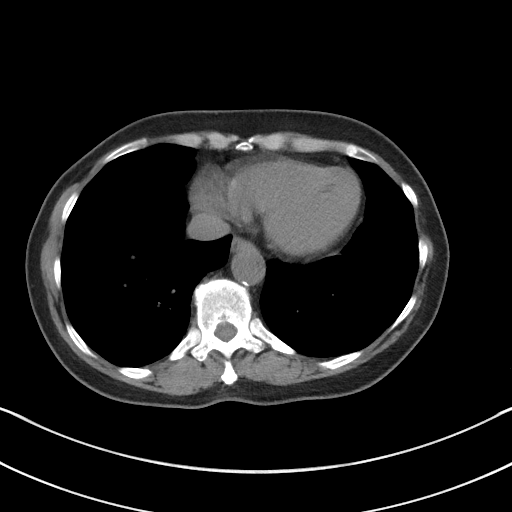

[Series 5: coronal st · coronal · 0.89mm/px · 3 of 84 slices shown]
[im 28/84  soft-tissue]
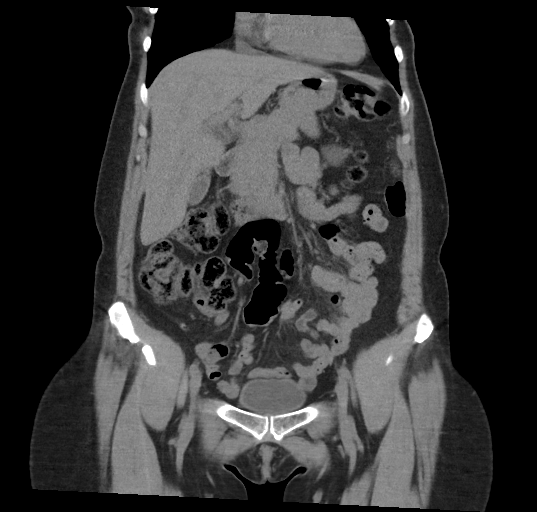
[im 37/84  soft-tissue]
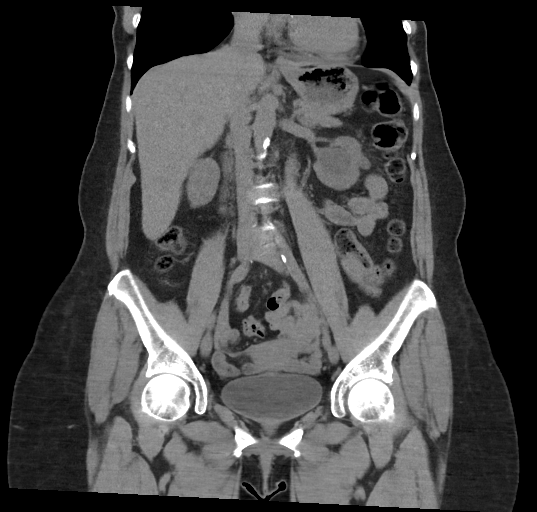
[im 47/84  soft-tissue]
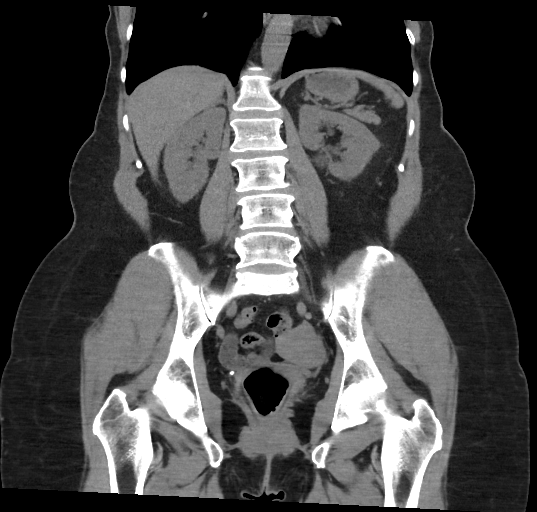

[16 of 46 positions shown; findings below may reference images not displayed]

FINDINGS: Lower chest: Mild linear atelectasis or scarring in the right lower
lobe. The lung bases are otherwise clear. Trace pericardial fluid
with mild coronary and aortic atherosclerosis.

Hepatobiliary: No focal hepatic abnormalities are identified on
noncontrast imaging. The gallbladder is incompletely distended. No
evidence of gallstones, gallbladder wall thickening or
pericholecystic fluid. There is mild intra and extrahepatic biliary
dilatation with the common hepatic duct measuring 9 mm in diameter.
No calcified intraductal calculi are visualized.

Pancreas: No evidence of pancreatic head or ampullary mass. There is
no pancreatic ductal dilatation, surrounding inflammation or fluid
collection.

Spleen: Normal in size without focal abnormality.

Adrenals/Urinary Tract: Both adrenal glands appear normal. There are
2 prominent water density cysts in the interpolar region of the left
kidney measuring up to 4.1 cm in diameter. Posteriorly in the mid
left kidney, there is a subcentimeter hyperdense lesion which is
likely a small hemorrhagic cysts. No evidence of urinary tract
calculus or hydronephrosis. The bladder appears normal.

Stomach/Bowel: No enteric contrast administered. The stomach appears
unremarkable for its degree of distension. No evidence of bowel wall
thickening, distention or surrounding inflammatory change. The
appendix appears normal.

Vascular/Lymphatic: There are no enlarged abdominal or pelvic lymph
nodes. Aortic and branch vessel atherosclerosis without acute
vascular findings on noncontrast imaging.

Reproductive: There is a 5.6 x 4.5 cm left adnexal mass on image
60/2. This appears homogeneous in density and abuts the uterus,
possibly reflecting an exophytic fibroid. The uterus otherwise
appears normal. No right adnexal mass or pelvic inflammatory
changes.

Other: No evidence of abdominal wall mass or hernia. No ascites.

Musculoskeletal: No acute or significant osseous findings. Lumbar
spine facet arthropathy.
IMPRESSION: 1. Mild intrahepatic and extrahepatic biliary dilatation of
undetermined etiology. No calcified stones are identified. Consider
right upper quadrant abdominal ultrasound or MRCP for further
evaluation.
2. No evidence of pancreatic or ampullary lesion. No pancreatic
ductal dilatation.
3. Indeterminate left adnexal lesion, potentially an exophytic
fibroid. This is incompletely evaluated by this noncontrast study,
and further evaluation with pelvic ultrasound recommended.
4.  Aortic Atherosclerosis ([id number]).

## 2022-01-26 ENCOUNTER — Other Ambulatory Visit: Payer: Self-pay | Admitting: Family Medicine

## 2022-01-26 DIAGNOSIS — Z1231 Encounter for screening mammogram for malignant neoplasm of breast: Secondary | ICD-10-CM

## 2022-02-18 DIAGNOSIS — E7800 Pure hypercholesterolemia, unspecified: Secondary | ICD-10-CM | POA: Diagnosis not present

## 2022-02-18 DIAGNOSIS — N289 Disorder of kidney and ureter, unspecified: Secondary | ICD-10-CM | POA: Diagnosis not present

## 2022-02-18 DIAGNOSIS — R7303 Prediabetes: Secondary | ICD-10-CM | POA: Diagnosis not present

## 2022-02-25 DIAGNOSIS — E7800 Pure hypercholesterolemia, unspecified: Secondary | ICD-10-CM | POA: Diagnosis not present

## 2022-02-25 DIAGNOSIS — R7303 Prediabetes: Secondary | ICD-10-CM | POA: Diagnosis not present

## 2022-02-25 DIAGNOSIS — R030 Elevated blood-pressure reading, without diagnosis of hypertension: Secondary | ICD-10-CM | POA: Diagnosis not present

## 2022-02-25 DIAGNOSIS — F5101 Primary insomnia: Secondary | ICD-10-CM | POA: Diagnosis not present

## 2022-03-08 ENCOUNTER — Ambulatory Visit
Admission: RE | Admit: 2022-03-08 | Discharge: 2022-03-08 | Disposition: A | Discharge status: Home / Self Care | Payer: Medicare HMO | Source: Ambulatory Visit | Attending: Family Medicine | Admitting: Family Medicine

## 2022-03-08 DIAGNOSIS — Z1231 Encounter for screening mammogram for malignant neoplasm of breast: Secondary | ICD-10-CM | POA: Insufficient documentation

## 2022-05-25 DIAGNOSIS — R519 Headache, unspecified: Secondary | ICD-10-CM | POA: Diagnosis not present

## 2022-05-25 DIAGNOSIS — J069 Acute upper respiratory infection, unspecified: Secondary | ICD-10-CM | POA: Diagnosis not present

## 2022-08-19 DIAGNOSIS — E7800 Pure hypercholesterolemia, unspecified: Secondary | ICD-10-CM | POA: Diagnosis not present

## 2022-08-19 DIAGNOSIS — R7303 Prediabetes: Secondary | ICD-10-CM | POA: Diagnosis not present

## 2022-08-26 DIAGNOSIS — R7303 Prediabetes: Secondary | ICD-10-CM | POA: Diagnosis not present

## 2022-08-26 DIAGNOSIS — Z0000 Encounter for general adult medical examination without abnormal findings: Secondary | ICD-10-CM | POA: Diagnosis not present

## 2022-08-26 DIAGNOSIS — I10 Essential (primary) hypertension: Secondary | ICD-10-CM | POA: Diagnosis not present

## 2022-08-26 DIAGNOSIS — E785 Hyperlipidemia, unspecified: Secondary | ICD-10-CM | POA: Diagnosis not present

## 2022-08-26 DIAGNOSIS — Z1331 Encounter for screening for depression: Secondary | ICD-10-CM | POA: Diagnosis not present

## 2022-12-13 ENCOUNTER — Telehealth: Payer: Self-pay

## 2022-12-13 NOTE — Telephone Encounter (Signed)
Schedule repeat

## 2023-01-30 ENCOUNTER — Other Ambulatory Visit: Payer: Self-pay | Admitting: Family Medicine

## 2023-01-30 DIAGNOSIS — Z1231 Encounter for screening mammogram for malignant neoplasm of breast: Secondary | ICD-10-CM

## 2023-02-15 DIAGNOSIS — R7303 Prediabetes: Secondary | ICD-10-CM | POA: Diagnosis not present

## 2023-02-15 DIAGNOSIS — B029 Zoster without complications: Secondary | ICD-10-CM | POA: Diagnosis not present

## 2023-02-27 DIAGNOSIS — E7800 Pure hypercholesterolemia, unspecified: Secondary | ICD-10-CM | POA: Diagnosis not present

## 2023-02-27 DIAGNOSIS — R7303 Prediabetes: Secondary | ICD-10-CM | POA: Diagnosis not present

## 2023-03-06 DIAGNOSIS — R7303 Prediabetes: Secondary | ICD-10-CM | POA: Diagnosis not present

## 2023-03-06 DIAGNOSIS — E7800 Pure hypercholesterolemia, unspecified: Secondary | ICD-10-CM | POA: Diagnosis not present

## 2023-03-06 DIAGNOSIS — N289 Disorder of kidney and ureter, unspecified: Secondary | ICD-10-CM | POA: Diagnosis not present

## 2023-03-10 ENCOUNTER — Ambulatory Visit
Admission: RE | Admit: 2023-03-10 | Discharge: 2023-03-10 | Disposition: A | Discharge status: Home / Self Care | Payer: Medicare HMO | Source: Ambulatory Visit | Attending: Family Medicine | Admitting: Family Medicine

## 2023-03-10 ENCOUNTER — Other Ambulatory Visit: Payer: Self-pay | Admitting: Family Medicine

## 2023-03-10 DIAGNOSIS — Z1231 Encounter for screening mammogram for malignant neoplasm of breast: Secondary | ICD-10-CM | POA: Insufficient documentation

## 2023-06-22 DIAGNOSIS — E7800 Pure hypercholesterolemia, unspecified: Secondary | ICD-10-CM | POA: Diagnosis not present

## 2023-06-22 DIAGNOSIS — R7303 Prediabetes: Secondary | ICD-10-CM | POA: Diagnosis not present

## 2023-06-22 DIAGNOSIS — N289 Disorder of kidney and ureter, unspecified: Secondary | ICD-10-CM | POA: Diagnosis not present

## 2023-06-29 DIAGNOSIS — E7800 Pure hypercholesterolemia, unspecified: Secondary | ICD-10-CM | POA: Diagnosis not present

## 2023-06-29 DIAGNOSIS — R7303 Prediabetes: Secondary | ICD-10-CM | POA: Diagnosis not present

## 2023-06-29 DIAGNOSIS — N1831 Chronic kidney disease, stage 3a: Secondary | ICD-10-CM | POA: Diagnosis not present

## 2023-06-29 DIAGNOSIS — R030 Elevated blood-pressure reading, without diagnosis of hypertension: Secondary | ICD-10-CM | POA: Diagnosis not present

## 2024-01-02 DIAGNOSIS — E7800 Pure hypercholesterolemia, unspecified: Secondary | ICD-10-CM | POA: Diagnosis not present

## 2024-01-02 DIAGNOSIS — N1831 Chronic kidney disease, stage 3a: Secondary | ICD-10-CM | POA: Diagnosis not present

## 2024-01-02 DIAGNOSIS — R7303 Prediabetes: Secondary | ICD-10-CM | POA: Diagnosis not present

## 2024-01-09 DIAGNOSIS — Z0000 Encounter for general adult medical examination without abnormal findings: Secondary | ICD-10-CM | POA: Diagnosis not present

## 2024-01-09 DIAGNOSIS — R7303 Prediabetes: Secondary | ICD-10-CM | POA: Diagnosis not present

## 2024-01-09 DIAGNOSIS — Z1331 Encounter for screening for depression: Secondary | ICD-10-CM | POA: Diagnosis not present

## 2024-01-09 DIAGNOSIS — D72819 Decreased white blood cell count, unspecified: Secondary | ICD-10-CM | POA: Diagnosis not present

## 2024-01-09 DIAGNOSIS — E785 Hyperlipidemia, unspecified: Secondary | ICD-10-CM | POA: Diagnosis not present

## 2024-01-15 DIAGNOSIS — Z780 Asymptomatic menopausal state: Secondary | ICD-10-CM | POA: Diagnosis not present

## 2024-01-24 ENCOUNTER — Other Ambulatory Visit: Payer: Self-pay | Admitting: Family Medicine

## 2024-01-24 DIAGNOSIS — Z1231 Encounter for screening mammogram for malignant neoplasm of breast: Secondary | ICD-10-CM

## 2024-03-11 ENCOUNTER — Other Ambulatory Visit: Payer: Self-pay | Admitting: Family Medicine

## 2024-03-11 ENCOUNTER — Ambulatory Visit
Admission: RE | Admit: 2024-03-11 | Discharge: 2024-03-11 | Disposition: A | Discharge status: Home / Self Care | Source: Ambulatory Visit | Attending: Family Medicine | Admitting: Family Medicine

## 2024-03-11 DIAGNOSIS — Z1231 Encounter for screening mammogram for malignant neoplasm of breast: Secondary | ICD-10-CM
